# Patient Record
Sex: Female | Born: 1955
Health system: Southern US, Community
[De-identification: ages and names within clinical notes are randomized; demographics above are authoritative.]

## PROBLEM LIST (undated history)

## (undated) DIAGNOSIS — F1910 Other psychoactive substance abuse, uncomplicated: Secondary | ICD-10-CM

## (undated) DIAGNOSIS — E7800 Pure hypercholesterolemia, unspecified: Secondary | ICD-10-CM

## (undated) DIAGNOSIS — F419 Anxiety disorder, unspecified: Secondary | ICD-10-CM

## (undated) DIAGNOSIS — F32A Depression, unspecified: Secondary | ICD-10-CM

## (undated) DIAGNOSIS — F329 Major depressive disorder, single episode, unspecified: Secondary | ICD-10-CM

## (undated) DIAGNOSIS — E079 Disorder of thyroid, unspecified: Secondary | ICD-10-CM

## (undated) DIAGNOSIS — D219 Benign neoplasm of connective and other soft tissue, unspecified: Secondary | ICD-10-CM

## (undated) HISTORY — PX: OTHER SURGICAL HISTORY: SHX169

## (undated) HISTORY — DX: Disorder of thyroid, unspecified: E07.9

## (undated) HISTORY — DX: Depression, unspecified: F32.A

## (undated) HISTORY — DX: Pure hypercholesterolemia, unspecified: E78.00

## (undated) HISTORY — DX: Benign neoplasm of connective and other soft tissue, unspecified: D21.9

## (undated) HISTORY — DX: Anxiety disorder, unspecified: F41.9

## (undated) HISTORY — DX: Other psychoactive substance abuse, uncomplicated: F19.10

## (undated) HISTORY — PX: CATARACT EXTRACTION: SUR2

## (undated) HISTORY — DX: Major depressive disorder, single episode, unspecified: F32.9

## (undated) HISTORY — PX: UTERINE FIBROID SURGERY: SHX826

---

## 1998-04-10 HISTORY — PX: OTHER SURGICAL HISTORY: SHX169

## 2011-05-14 ENCOUNTER — Ambulatory Visit (INDEPENDENT_AMBULATORY_CARE_PROVIDER_SITE_OTHER): Payer: BC Managed Care – PPO | Admitting: Family Medicine

## 2011-05-14 VITALS — BP 105/66 | HR 65 | Temp 97.9°F | Resp 18 | Ht 69.5 in | Wt 200.6 lb

## 2011-05-14 DIAGNOSIS — R059 Cough, unspecified: Secondary | ICD-10-CM

## 2011-05-14 DIAGNOSIS — R058 Other specified cough: Secondary | ICD-10-CM

## 2011-05-14 DIAGNOSIS — R05 Cough: Secondary | ICD-10-CM

## 2011-05-14 MED ORDER — PREDNISONE 20 MG PO TABS: 40.0000 mg | ORAL_TABLET | Freq: Every day | ORAL | Status: AC

## 2011-05-14 NOTE — Progress Notes (Signed)
  Subjective:    Patient ID: Robin Gilbert, female    DOB: 1955/12/11, 56 y.o.   MRN: [id number]  HPI 56 yo female with URI complaints. 2-3 weeks.  Has gone to PCP 3 times.  Taken 2 different antibiotics - zpak, doxycycline.   Fever gone, feels better mostly except sore throat and chest congestion/cough (excessive mucus - clear) Doesn't interrupt sleep anymore.  Finished doxy yesterday.  Only took antibiotics, no other treatments.   Quit smoking 1997 No history of asthma. States ventilation at job not good - makes things worse.   Review of Systems Negative except as per HPI    Objective:   Physical Exam  Constitutional: She appears well-developed. No distress.  HENT:  Right Ear: Tympanic membrane, external ear and ear canal normal. Tympanic membrane is not injected, not scarred, not perforated, not erythematous, not retracted and not bulging.  Left Ear: Tympanic membrane, external ear and ear canal normal. Tympanic membrane is not injected, not scarred, not perforated, not erythematous, not retracted and not bulging.  Nose: No mucosal edema or rhinorrhea. Right sinus exhibits no maxillary sinus tenderness and no frontal sinus tenderness. Left sinus exhibits no maxillary sinus tenderness and no frontal sinus tenderness.  Mouth/Throat: Uvula is midline, oropharynx is clear and moist and mucous membranes are normal. No oropharyngeal exudate or tonsillar abscesses.  Cardiovascular: Normal rate, regular rhythm, normal heart sounds and intact distal pulses.   No murmur heard. Pulmonary/Chest: Effort normal and breath sounds normal. No respiratory distress. She has no wheezes. She has no rales.  Lymphadenopathy:       Head (right side): No submandibular and no preauricular adenopathy present.       Head (left side): No submandibular and no preauricular adenopathy present.       Right cervical: No superficial cervical and no posterior cervical adenopathy present.      Left cervical: No  superficial cervical and no posterior cervical adenopathy present.       Right: No supraclavicular adenopathy present.       Left: No supraclavicular adenopathy present.  Skin: Skin is warm and dry.          Assessment & Plan:  Post-infectious cough - prednisone 20, 2 daily for 5 days.  3rd round antibiotics not necessary

## 2011-05-14 NOTE — Patient Instructions (Signed)
Thank you for coming in today.  I appreciate your patience as we become more comfortable with our computer system.  Today you saw Ardeen Garland, MD. I hope you feel better quickly. Please review the information below regarding your diagnosis(es) at your leisure.     Cough, Adult  A cough is a reflex that helps clear your throat and airways. It can help heal the body or may be a reaction to an irritated airway. A cough may only last 2 or 3 weeks (acute) or may last more than 8 weeks (chronic).  CAUSES Acute cough:  Viral or bacterial infections.  Chronic cough:  Infections.   Allergies.   Asthma.   Post-nasal drip.   Smoking.   Heartburn or acid reflux.   Some medicines.   Chronic lung problems (COPD).   Cancer.  SYMPTOMS   Cough.   Fever.   Chest pain.   Increased breathing rate.   High-pitched whistling sound when breathing (wheezing).   Colored mucus that you cough up (sputum).  TREATMENT   A bacterial cough may be treated with antibiotic medicine.   A viral cough must run its course and will not respond to antibiotics.   Your caregiver may recommend other treatments if you have a chronic cough.  HOME CARE INSTRUCTIONS   Only take over-the-counter or prescription medicines for pain, discomfort, or fever as directed by your caregiver. Use cough suppressants only as directed by your caregiver.   Use a cold steam vaporizer or humidifier in your bedroom or home to help loosen secretions.   Sleep in a semi-upright position if your cough is worse at night.   Rest as needed.   Stop smoking if you smoke.  SEEK IMMEDIATE MEDICAL CARE IF:   You have pus in your sputum.   Your cough starts to worsen.   You cannot control your cough with suppressants and are losing sleep.   You begin coughing up blood.   You have difficulty breathing.   You develop pain which is getting worse or is uncontrolled with medicine.   You have a fever.  MAKE SURE YOU:    Understand these instructions.   Will watch your condition.   Will get help right away if you are not doing well or get worse.  Document Released: 09/23/2010 Document Revised: 12/07/2010 Document Reviewed: 09/23/2010 [facility] Patient Information 2012 [place], [place].

## 2011-08-22 ENCOUNTER — Other Ambulatory Visit (HOSPITAL_COMMUNITY)
Admission: RE | Admit: 2011-08-22 | Discharge: 2011-08-22 | Disposition: A | Discharge status: Home / Self Care | Payer: BC Managed Care – PPO | Source: Ambulatory Visit | Attending: Family Medicine | Admitting: Family Medicine

## 2011-08-22 DIAGNOSIS — Z1159 Encounter for screening for other viral diseases: Secondary | ICD-10-CM | POA: Insufficient documentation

## 2011-08-22 DIAGNOSIS — Z01419 Encounter for gynecological examination (general) (routine) without abnormal findings: Secondary | ICD-10-CM | POA: Insufficient documentation

## 2014-09-25 ENCOUNTER — Ambulatory Visit: Payer: BC Managed Care – PPO | Admitting: Podiatry

## 2015-10-28 ENCOUNTER — Encounter: Payer: BC Managed Care – PPO | Admitting: Neurology

## 2016-04-05 ENCOUNTER — Ambulatory Visit (INDEPENDENT_AMBULATORY_CARE_PROVIDER_SITE_OTHER): Payer: BC Managed Care – PPO | Admitting: Neurology

## 2016-04-05 ENCOUNTER — Encounter: Payer: Self-pay | Admitting: Neurology

## 2016-04-05 VITALS — BP 106/64 | HR 71 | Ht 70.0 in | Wt 193.0 lb

## 2016-04-05 DIAGNOSIS — R2689 Other abnormalities of gait and mobility: Secondary | ICD-10-CM

## 2016-04-05 DIAGNOSIS — R251 Tremor, unspecified: Secondary | ICD-10-CM | POA: Diagnosis not present

## 2016-04-05 DIAGNOSIS — R269 Unspecified abnormalities of gait and mobility: Secondary | ICD-10-CM

## 2016-04-05 DIAGNOSIS — W19XXXA Unspecified fall, initial encounter: Secondary | ICD-10-CM

## 2016-04-05 NOTE — Patient Instructions (Signed)
Remember to drink plenty of fluid, eat healthy meals and do not skip any meals. Try to eat protein with a every meal and eat a healthy snack such as fruit or nuts in between meals. Try to keep a regular sleep-wake schedule and try to exercise daily, particularly in the form of walking, 20-30 minutes a day, if you can.   As far as diagnostic testing: Thyroid, physical therapy  I would like to see you back as needed, sooner if we need to. Please call us with any interim questions, concerns, problems, updates or refill requests.   Our phone number is [phone]. We also have an after hours call service for urgent matters and there is a physician on-call for urgent questions. For any emergencies you know to call 911 or go to the nearest emergency room

## 2016-04-05 NOTE — Progress Notes (Signed)
[facility]    Provider:  Dr Jaynee Eagles Referring Provider: Lucianne Lei, MD Primary Care Physician:  Elyn Peers, MD  CC:  Abnormality of gait  HPI:  Robin Gilbert is a 60 y.o. female here as a referral from Dr. Criss Rosales for abnormality of gait and falls. She has a history of remote alcohol abuse and smoking. She had 2 falls a few months ago, she changed her shoes to make sure they were flat, she was walking and just fell. She was in boots and changed to flats and feels better but she was falling. She denies dizziness, SOB, she tripped unclear why, no alteration of consciousness. Her balance is not as good, she cannot walk heel-to-toe backwards with her eyes closed. She denies significant dizziness, she feels she is not as sharp as far as cognition, she has a history of alzheimers in the family. She has chronic back pain and herniated disks and goes to a chiropractor she has stiffness in her back especially with sitting and walking due to stiffness. She has a little numbness when she wakes up in her left hand but otherwise no paresthesias or numbness in the feet or sensory changes in the feet. Mother had parkinson's disease but maybe due to medications, she also had tremors unclear if medications or due to other causes. No weakness in the lower extremities. Not exercising or balance exercises daily.   Reviewed notes, labs and imaging from outside physicians, which showed:  CMP normal with BUN 21 and creatinine 0.78, CBC normal 02/21/2016. LDL 129 02/21/2016. Hepatitis B surface antigen, hepatitis C antibody, hepatitis B core antibody IgM nonreactive hepatitis A antibody IgM nonreactive.  Review of Systems: Patient complains of symptoms per HPI as well as the following symptoms: no CP, no SOB. Pertinent negatives per HPI. All others negative.   Social History   Social History  . Marital status: Divorced    Spouse name: N/A  . Number of children: 0  . Years of education:  Master's   Occupational History  . [employer]    Social History Main Topics  . Smoking status: Former Smoker    Quit date: 05/14/1995  . Smokeless tobacco: Never Used  . Alcohol use No  . Drug use: No  . Sexual activity: Not on file   Other Topics Concern  . Not on file   Social History Narrative   Lives alone   Caffeine use: Drinks coffee/tea/soda ocass    Family History  Problem Relation Age of Onset  . Ataxia Neg Hx     Past Medical History:  Diagnosis Date  . Anxiety   . Depression   . High cholesterol     Past Surgical History:  Procedure Laterality Date  . cyst removed     Vagina  . UTERINE FIBROID SURGERY      Current Outpatient Prescriptions  Medication Sig Dispense Refill  . QUERCETIN PO Take 1 tablet by mouth daily. Will resume taking this week 04/05/16 per pt    . UNABLE TO FIND Take 2 tablets by mouth daily. Med Name: Brain E    . UNABLE TO FIND Take 1 tablet by mouth 2 (two) times daily. Med Name: Acetyl-CH    . UNABLE TO FIND Take 2 tablets by mouth daily. Med Name: Organically bound minerals: for sleep    . vitamin C (ASCORBIC ACID) 500 MG tablet Take 500 mg by mouth daily. Takes 1-2 tablets per day    . VITAMIN E  PO Take 2 tablets by mouth daily. Will resume taking this week 04/05/16 per pt     No current facility-administered medications for this visit.     Allergies as of 04/05/2016 - Review Complete 04/05/2016  Allergen Reaction Noted  . Amoxicillin  05/14/2011    Vitals: BP 106/64 (BP Location: Right Arm, Patient Position: Sitting, Cuff Size: Normal)   Pulse 71   Ht 5\' 10"  (1.778 m)   Wt 193 lb (87.5 kg)   BMI 27.69 kg/m  Last Weight:  Wt Readings from Last 1 Encounters:  04/05/16 193 lb (87.5 kg)   Last Height:   Ht Readings from Last 1 Encounters:  04/05/16 5\' 10"  (1.778 m)   Physical exam: Exam: Gen: NAD, conversant, well nourised, well groomed                     CV: RRR, no MRG. No Carotid  Bruits. No peripheral edema, warm, nontender Eyes: Conjunctivae clear without exudates or hemorrhage  Neuro: Detailed Neurologic Exam  Speech:    Speech is normal; fluent and spontaneous with normal comprehension.  Cognition:    The patient is oriented to person, place, and time;     recent and remote memory intact;     language fluent;     normal attention, concentration,     fund of knowledge Cranial Nerves:    The pupils are equal, round, and reactive to light. The fundi are normal and spontaneous venous pulsations are present. Visual fields are full to finger confrontation. Extraocular movements are intact. Trigeminal sensation is intact and the muscles of mastication are normal. The face is symmetric. The palate elevates in the midline. Hearing intact. Voice is normal. Shoulder shrug is normal. The tongue has normal motion without fasciculations.   Coordination:    Normal finger to nose and heel to shin. Normal rapid alternating movements.   Gait:    Minimal imbalance heel to toe.   Otherwise perfectly normal  Motor Observation:    No asymmetry, no atrophy, mild left postural tremor Tone:    Normal muscle tone.    Posture:    Posture is normal. normal erect    Strength:    Strength is V/V in the upper and lower limbs.      Sensation: intact to LT, pin prick, vibration, proprioception     Reflex Exam:  DTR's:    Deep tendon reflexes in the upper and lower extremities are normal bilaterally.   Toes:    The toes are downgoing bilaterally.   Clonus:    Clonus is absent.     Assessment/Plan:   60 y.o. female here as a referral from Dr. Criss Rosales for abnormality of gait and falls. She has a history of remote alcohol abuse and smoking. Neurologic exam is essentially normal with minimal imbalance heel-to-toe and a mild left postural tremor.  Postural tremor: May be essential tremor. Check tsh Imbalance: Will refer to physical therapy  Follow up as needed  Cc: Dr.  Girard Cooter, MD  [facility] [address] [place], [place] [address]  Phone [phone] Fax [phone]  A total of 40 minutes was spent in with this patient face-to-face. Over half this time was spent on counseling patient on the gait abnormality diagnosis and different therapeutic options available.

## 2016-04-06 ENCOUNTER — Telehealth: Payer: Self-pay | Admitting: *Deleted

## 2016-04-06 LAB — TSH: TSH: 1.76 u[IU]/mL (ref 0.450–4.500)

## 2016-04-06 NOTE — Telephone Encounter (Signed)
Per Daun Peacock, NP, LVM informing patient her thyroid lab result is normal. Left number for any questions.

## 2016-04-06 NOTE — Telephone Encounter (Signed)
Addendum/correction: message left per Dr Cathren Laine result note, not Daun Peacock, NP.

## 2016-04-07 ENCOUNTER — Encounter: Payer: Self-pay | Admitting: Neurology

## 2016-04-07 DIAGNOSIS — W19XXXA Unspecified fall, initial encounter: Secondary | ICD-10-CM | POA: Insufficient documentation

## 2016-04-07 DIAGNOSIS — R296 Repeated falls: Secondary | ICD-10-CM | POA: Insufficient documentation

## 2016-05-11 HISTORY — PX: COLONOSCOPY: SHX174

## 2016-05-25 ENCOUNTER — Ambulatory Visit: Payer: BC Managed Care – PPO | Attending: Neurology | Admitting: Physical Therapy

## 2016-06-05 LAB — HM COLONOSCOPY

## 2016-06-12 ENCOUNTER — Ambulatory Visit: Payer: BC Managed Care – PPO | Attending: Neurology | Admitting: Physical Therapy

## 2017-09-24 ENCOUNTER — Other Ambulatory Visit: Payer: Self-pay | Admitting: Family Medicine

## 2017-09-24 DIAGNOSIS — Z1231 Encounter for screening mammogram for malignant neoplasm of breast: Secondary | ICD-10-CM

## 2020-02-26 ENCOUNTER — Ambulatory Visit: Payer: BC Managed Care – PPO | Attending: Family

## 2020-02-26 DIAGNOSIS — Z23 Encounter for immunization: Secondary | ICD-10-CM

## 2020-04-23 ENCOUNTER — Other Ambulatory Visit: Payer: Self-pay | Admitting: Internal Medicine

## 2020-04-23 DIAGNOSIS — R1900 Intra-abdominal and pelvic swelling, mass and lump, unspecified site: Secondary | ICD-10-CM

## 2020-05-05 ENCOUNTER — Other Ambulatory Visit: Payer: Self-pay

## 2020-05-05 ENCOUNTER — Ambulatory Visit
Admission: RE | Admit: 2020-05-05 | Discharge: 2020-05-05 | Disposition: A | Discharge status: Home / Self Care | Payer: BC Managed Care – PPO | Source: Ambulatory Visit | Attending: Internal Medicine | Admitting: Internal Medicine

## 2020-05-05 DIAGNOSIS — R1900 Intra-abdominal and pelvic swelling, mass and lump, unspecified site: Secondary | ICD-10-CM

## 2020-05-12 ENCOUNTER — Other Ambulatory Visit: Payer: Self-pay

## 2020-06-09 NOTE — Progress Notes (Signed)
   Covid-19 Vaccination Clinic  Name:  Robin Gilbert    MRN: [id number] DOB: October 13, 1955  06/09/2020  [name] was observed post Covid-19 immunization for 15 minutes without incident. She was provided with Vaccine Information Sheet and instruction to access the V-Safe system.   [name] was instructed to call 911 with any severe reactions post vaccine: Marland Kitchen Difficulty breathing  . Swelling of face and throat  . A fast heartbeat  . A bad rash all over body  . Dizziness and weakness   Immunizations Administered    Name Date Dose VIS Date Route   Moderna Covid-19 Booster Vaccine 02/26/2020 10:15 AM 0.25 mL 01/28/2020 Intramuscular   Manufacturer: Moderna   Lot: [id number]   [place]: 22179-810-25

## 2020-07-08 NOTE — Progress Notes (Addendum)
[facility] Note  07/12/2020     CHIEF COMPLAINT Patient presents for Retina Evaluation   HISTORY OF PRESENT ILLNESS: Robin Gilbert is a 65 y.o. female who presents to the clinic today for:   HPI    Retina Evaluation    In both eyes.  I, the attending physician,  performed the HPI with the patient and updated documentation appropriately.          Comments    Pt is here for retinal evaluation. Referred by Dr. Katy Fitch. Pt states vision is OK. Dr. Katy Fitch recommended cat sx. Dr wanted eval before having cataract sx. No eye pain.        Last edited by Bernarda Caffey, MD on 07/12/2020  9:11 AM. (History)    pt states she has a hx of high cholesterol, but is not on medication for it, she states she changed her diet and is now vegan and her cholesterol is much better  Referring physician: Debbra Riding, MD [address] [place],  [place] [address]  HISTORICAL INFORMATION:   Selected notes from the MEDICAL RECORD NUMBER Referred by Dr. Wyatt Portela for focal perivascular sheathing vs exudates OD, inf nasal periphery  LEE:  Ocular Hx- PMH-    CURRENT MEDICATIONS: No current outpatient medications on file. (Ophthalmic Drugs)   No current facility-administered medications for this visit. (Ophthalmic Drugs)   Current Outpatient Medications (Other)  Medication Sig  . QUERCETIN PO Take 1 tablet by mouth daily. Will resume taking this week 04/05/16 per pt  . UNABLE TO FIND Take 2 tablets by mouth daily. Med Name: Brain E  . UNABLE TO FIND Take 1 tablet by mouth 2 (two) times daily. Med Name: Acetyl-CH  . UNABLE TO FIND Take 2 tablets by mouth daily. Med Name: Organically bound minerals: for sleep  . vitamin C (ASCORBIC ACID) 500 MG tablet Take 500 mg by mouth daily. Takes 1-2 tablets per day  . VITAMIN E PO Take 2 tablets by mouth daily. Will resume taking this week 04/05/16 per pt   No current facility-administered medications for this visit.  (Other)      REVIEW OF SYSTEMS: ROS    Positive for: Eyes   Last edited by Theodore Demark, COA on 07/12/2020  8:36 AM. (History)       ALLERGIES Allergies  Allergen Reactions  . Amoxicillin     Pt states she had to take probiotics when she took this. Did not agree with her    PAST MEDICAL HISTORY Past Medical History:  Diagnosis Date  . Anxiety   . Depression   . High cholesterol    Past Surgical History:  Procedure Laterality Date  . cyst removed     Vagina  . UTERINE FIBROID SURGERY      FAMILY HISTORY Family History  Problem Relation Age of Onset  . Ataxia Neg Hx     SOCIAL HISTORY Social History   Tobacco Use  . Smoking status: Former Smoker    Quit date: 05/14/1995    Years since quitting: 25.1  . Smokeless tobacco: Never Used  Substance Use Topics  . Alcohol use: No  . Drug use: No         OPHTHALMIC EXAM:  Base Eye Exam    Visual Acuity (Snellen - Linear)      Right Left   Dist cc 20/25 -2 20/25 +2   Dist ph cc 20/20 -2 NI  Tonometry (Tonopen, 8:32 AM)      Right Left   Pressure 22 20       Pupils      Dark Light Shape React APD   Right 4 3 Round Brisk None   Left 4 3 Round Brisk None       Visual Fields (Counting fingers)      Left Right    Full Full       Extraocular Movement      Right Left    Full, Ortho Full, Ortho       Neuro/Psych    Oriented x3: Yes   Mood/Affect: Normal       Dilation    Both eyes: 1.0% Mydriacyl, 2.5% Phenylephrine @ 8:32 AM        Slit Lamp and Fundus Exam    Slit Lamp Exam      Right Left   Lids/Lashes Dermatochalasis - upper lid Dermatochalasis - upper lid   Conjunctiva/Sclera mild melanosis mild melanosis   Cornea trace PEE trace PEE   Anterior Chamber Deep and quiet Deep and quiet   Iris Round and dilated Round and dilated   Lens 2+ Nuclear sclerosis, 1+ Cortical cataract 2-3+ Nuclear sclerosis, 2+ Cortical cataract   Vitreous Vitreous syneresis Vitreous syneresis,  Posterior vitreous detachment, Weiss ring       Fundus Exam      Right Left   Disc extreme tilt, PPA/PPP, +SVP extreme tilt, PPA/PPP   C/D Ratio 0.5 0.3   Macula Flat, Good foveal reflex, RPE mottling, No heme or edema Flat, irregular foveal reflex, RPE mottling and clumping, No heme or edema   Vessels attenuated, mild tortuousity, periphery sheathing v exudation IN venules mild attenuation, mild tortuousity   Periphery Attached, no heme, perivascular exudations v sheating inferonasal peripheral venules  Attached, no heme        Refraction    Wearing Rx      Sphere Cylinder Axis Add   Right -4.75 +1.75 017 +1.75   Left -3.25 +0.50 175 +1.75       Manifest Refraction (Auto)      Sphere Cylinder Axis Dist VA   Right -4.50 +1.50 015 20/25   Left -3.75 +0.75 150 20/25          IMAGING AND PROCEDURES  Imaging and Procedures for 07/12/2020  OCT, Retina - OU - Both Eyes       Right Eye Quality was good. Central Foveal Thickness: 258. Progression has no prior data. Findings include normal foveal contour, no IRF, no SRF, myopic contour.   Left Eye Quality was good. Central Foveal Thickness: 264. Progression has no prior data. Findings include no IRF, abnormal foveal contour, no SRF, myopic contour (Foveal notch).   Notes *Images captured and stored on drive  Diagnosis / Impression:  NFP; no IRF/SRF OU Myopic contour OU Small foveal notch OS  Clinical management:  See below  Abbreviations: NFP - Normal foveal profile. CME - cystoid macular edema. PED - pigment epithelial detachment. IRF - intraretinal fluid. SRF - subretinal fluid. EZ - ellipsoid zone. ERM - epiretinal membrane. ORA - outer retinal atrophy. ORT - outer retinal tubulation. SRHM - subretinal hyper-reflective material. IRHM - intraretinal hyper-reflective material        Fluorescein Angiography Optos (Transit OD)       Right Eye   Progression has no prior data. Early phase findings include window  defect, vascular perfusion defect. Mid/Late phase findings include window defect, vascular perfusion defect (  No leakage, trace peripheral MA nasal periphery).   Left Eye   Progression has no prior data. Early phase findings include window defect, vascular perfusion defect. Mid/Late phase findings include window defect, vascular perfusion defect (trace peripheral MA far periphery, no leakage or vasculitis ).   Notes **Images stored on drive**  Impression: trace peripheral MA OU No leakage or vasculitis OU                  ASSESSMENT/PLAN:    ICD-10-CM   1. Retinal exudates  H35.89   2. Retinal edema  H35.81 OCT, Retina - OU - Both Eyes  3. Essential hypertension  I10   4. Hypertensive retinopathy of both eyes  H35.033 Fluorescein Angiography Optos (Transit OD)  5. Combined forms of age-related cataract of both eyes  H25.813     1. Focal perivascular retinal exudates OD  - inferonasal peripheral venules with mild perivascular exudation vs sheathing  - no heme  - FA 4.4.22 show no leakage or vasculitis corresponding to exudates  - discussed findings, prognosis  - no treatment indicated or recommended at this time  - monitor  - clear from a retina standpoint to proceed with cataract surgery when pt and surgeon are ready  - f/u in 3 mos  2. No retinal edema on exam or OCT  3,4. Hypertensive retinopathy OU - discussed importance of tight BP control - monitor  5. Mixed Cataract OU - The symptoms of cataract, surgical options, and treatments and risks were discussed with patient. - discussed diagnosis and progression - under the expert management of Dr. Zenia Resides - clear from a retina standpoint to proceed with cataract surgery when pt and surgeon are ready    Ophthalmic Meds Ordered this visit:  No orders of the defined types were placed in this encounter.      Return in about 3 months (around 10/11/2020) for Dilated Exam, OCT.  There are no Patient Instructions  on file for this visit.  This document serves as a record of services personally performed by Gardiner Sleeper, MD, PhD. It was created on their behalf by Leeann Must, [place], an ophthalmic technician. The creation of this record is the provider's dictation and/or activities during the visit.    Electronically signed by: Leeann Must, COA 03.31.2022 1:21 PM   This document serves as a record of services personally performed by Gardiner Sleeper, MD, PhD. It was created on their behalf by San Jetty. Owens Shark, OA an ophthalmic technician. The creation of this record is the provider's dictation and/or activities during the visit.    Electronically signed by: San Jetty. Owens Shark, [place] 04.04.2022 1:21 PM  Gardiner Sleeper, M.D., Ph.D. Diseases & Surgery of the Retina and [facility] [place] 07/12/2020   I have reviewed the above documentation for accuracy and completeness, and I agree with the above. Gardiner Sleeper, M.D., Ph.D. 07/12/20 1:21 PM   Abbreviations: M myopia (nearsighted); A astigmatism; H hyperopia (farsighted); P presbyopia; Mrx spectacle prescription;  CTL contact lenses; OD right eye; OS left eye; OU both eyes  XT exotropia; ET esotropia; PEK punctate epithelial keratitis; PEE punctate epithelial erosions; DES dry eye syndrome; MGD meibomian gland dysfunction; ATs artificial tears; PFAT's preservative free artificial tears; [place] nuclear sclerotic cataract; PSC posterior subcapsular cataract; ERM epi-retinal membrane; PVD posterior vitreous detachment; RD retinal detachment; DM diabetes mellitus; DR diabetic retinopathy; NPDR non-proliferative diabetic retinopathy; PDR proliferative diabetic retinopathy; CSME clinically significant macular edema; DME diabetic  macular edema; dbh dot blot hemorrhages; CWS cotton wool spot; POAG primary open angle glaucoma; C/D cup-to-disc ratio; HVF humphrey visual field; GVF goldmann visual field; OCT optical coherence tomography; IOP  intraocular pressure; BRVO Branch retinal vein occlusion; CRVO central retinal vein occlusion; CRAO central retinal artery occlusion; BRAO branch retinal artery occlusion; RT retinal tear; SB scleral buckle; PPV pars plana vitrectomy; VH Vitreous hemorrhage; PRP panretinal laser photocoagulation; IVK intravitreal kenalog; VMT vitreomacular traction; MH Macular hole;  NVD neovascularization of the disc; NVE neovascularization elsewhere; AREDS age related eye disease study; ARMD age related macular degeneration; POAG primary open angle glaucoma; EBMD epithelial/anterior basement membrane dystrophy; ACIOL anterior chamber intraocular lens; IOL intraocular lens; PCIOL posterior chamber intraocular lens; Phaco/IOL phacoemulsification with intraocular lens placement; [place] photorefractive keratectomy; LASIK laser assisted in situ keratomileusis; HTN hypertension; DM diabetes mellitus; COPD chronic obstructive pulmonary disease

## 2020-07-12 ENCOUNTER — Other Ambulatory Visit: Payer: Self-pay

## 2020-07-12 ENCOUNTER — Ambulatory Visit (INDEPENDENT_AMBULATORY_CARE_PROVIDER_SITE_OTHER): Payer: Medicare HMO | Admitting: Ophthalmology

## 2020-07-12 ENCOUNTER — Encounter (INDEPENDENT_AMBULATORY_CARE_PROVIDER_SITE_OTHER): Payer: Self-pay | Admitting: Ophthalmology

## 2020-07-12 DIAGNOSIS — I10 Essential (primary) hypertension: Secondary | ICD-10-CM | POA: Diagnosis not present

## 2020-07-12 DIAGNOSIS — H3581 Retinal edema: Secondary | ICD-10-CM | POA: Diagnosis not present

## 2020-07-12 DIAGNOSIS — H3589 Other specified retinal disorders: Secondary | ICD-10-CM

## 2020-07-12 DIAGNOSIS — H25813 Combined forms of age-related cataract, bilateral: Secondary | ICD-10-CM

## 2020-07-12 DIAGNOSIS — H35033 Hypertensive retinopathy, bilateral: Secondary | ICD-10-CM | POA: Diagnosis not present

## 2020-09-08 ENCOUNTER — Ambulatory Visit: Payer: Medicare HMO | Admitting: Family Medicine

## 2020-09-10 DIAGNOSIS — M9904 Segmental and somatic dysfunction of sacral region: Secondary | ICD-10-CM | POA: Diagnosis not present

## 2020-09-10 DIAGNOSIS — M9902 Segmental and somatic dysfunction of thoracic region: Secondary | ICD-10-CM | POA: Diagnosis not present

## 2020-09-10 DIAGNOSIS — M5136 Other intervertebral disc degeneration, lumbar region: Secondary | ICD-10-CM | POA: Diagnosis not present

## 2020-09-10 DIAGNOSIS — M9901 Segmental and somatic dysfunction of cervical region: Secondary | ICD-10-CM | POA: Diagnosis not present

## 2020-09-10 DIAGNOSIS — M9903 Segmental and somatic dysfunction of lumbar region: Secondary | ICD-10-CM | POA: Diagnosis not present

## 2020-09-13 DIAGNOSIS — M9902 Segmental and somatic dysfunction of thoracic region: Secondary | ICD-10-CM | POA: Diagnosis not present

## 2020-09-13 DIAGNOSIS — M9903 Segmental and somatic dysfunction of lumbar region: Secondary | ICD-10-CM | POA: Diagnosis not present

## 2020-09-13 DIAGNOSIS — M9904 Segmental and somatic dysfunction of sacral region: Secondary | ICD-10-CM | POA: Diagnosis not present

## 2020-09-13 DIAGNOSIS — M5136 Other intervertebral disc degeneration, lumbar region: Secondary | ICD-10-CM | POA: Diagnosis not present

## 2020-09-13 DIAGNOSIS — M9901 Segmental and somatic dysfunction of cervical region: Secondary | ICD-10-CM | POA: Diagnosis not present

## 2020-09-15 DIAGNOSIS — M9901 Segmental and somatic dysfunction of cervical region: Secondary | ICD-10-CM | POA: Diagnosis not present

## 2020-09-15 DIAGNOSIS — M5136 Other intervertebral disc degeneration, lumbar region: Secondary | ICD-10-CM | POA: Diagnosis not present

## 2020-09-15 DIAGNOSIS — M9903 Segmental and somatic dysfunction of lumbar region: Secondary | ICD-10-CM | POA: Diagnosis not present

## 2020-09-15 DIAGNOSIS — M9902 Segmental and somatic dysfunction of thoracic region: Secondary | ICD-10-CM | POA: Diagnosis not present

## 2020-09-15 DIAGNOSIS — M9904 Segmental and somatic dysfunction of sacral region: Secondary | ICD-10-CM | POA: Diagnosis not present

## 2020-09-21 DIAGNOSIS — M9903 Segmental and somatic dysfunction of lumbar region: Secondary | ICD-10-CM | POA: Diagnosis not present

## 2020-09-21 DIAGNOSIS — M5136 Other intervertebral disc degeneration, lumbar region: Secondary | ICD-10-CM | POA: Diagnosis not present

## 2020-09-21 DIAGNOSIS — M9904 Segmental and somatic dysfunction of sacral region: Secondary | ICD-10-CM | POA: Diagnosis not present

## 2020-09-21 DIAGNOSIS — M9902 Segmental and somatic dysfunction of thoracic region: Secondary | ICD-10-CM | POA: Diagnosis not present

## 2020-09-21 DIAGNOSIS — M9901 Segmental and somatic dysfunction of cervical region: Secondary | ICD-10-CM | POA: Diagnosis not present

## 2020-09-23 ENCOUNTER — Other Ambulatory Visit: Payer: Self-pay

## 2020-09-23 ENCOUNTER — Ambulatory Visit (INDEPENDENT_AMBULATORY_CARE_PROVIDER_SITE_OTHER): Payer: Medicare HMO | Admitting: Family Medicine

## 2020-09-23 ENCOUNTER — Encounter: Payer: Self-pay | Admitting: Family Medicine

## 2020-09-23 VITALS — BP 120/68 | HR 68 | Ht 70.0 in | Wt 193.4 lb

## 2020-09-23 DIAGNOSIS — N62 Hypertrophy of breast: Secondary | ICD-10-CM

## 2020-09-23 DIAGNOSIS — Z7689 Persons encountering health services in other specified circumstances: Secondary | ICD-10-CM

## 2020-09-23 DIAGNOSIS — E7800 Pure hypercholesterolemia, unspecified: Secondary | ICD-10-CM

## 2020-09-23 NOTE — Progress Notes (Signed)
   Subjective:    Patient ID: Robin Gilbert, female    DOB: 1955-10-14, 65 y.o.   MRN: [id number]  HPI Chief Complaint  Patient presents with   new pt get established    New pt get established. Would like hormones checked. Has swelling in breasts but had mammogram and everyhting was normal- PQ-9.    She is new to the practice and here to establish care Previous medical care: Dr. Willey Blade, [facility] and last there for UTI   Other providers: GI- Dr. Collene Mares OB/GYN Neurologist- Dr. Bedelia Person   States she recently did [facility] and brought in her results today which she would like for me to review.  Her LDL was elevated, otherwise did not see any issues.   States her breasts have been enlarging over the past 2 years.  Reports going from a size due to her size H cup.  States she has not gained weight. states she would like to look for reasons for this including checking her hormone levels. Mammogram in January 2022 at [facility] and negative per patient.  I do not have this result today. Denies any other breast abnormality.  No breast pain or nipple discharge.  HTN-discussed that this diagnosis is in her chart but she denies having a history of hypertension.  Retinopathy related to HTN is also in her chart  Hx of fibroids      Review of Systems Pertinent positives and negatives in the history of present illness.     Objective:   Physical Exam BP 120/68   Pulse 68   Ht 5\' 10"  (1.778 m)   Wt 193 lb 6.4 oz (87.7 kg)   BMI 27.75 kg/m   Alert and in no distress. Neck is supple without adenopathy or thyromegaly. Cardiac exam shows a regular sinus rhythm without murmurs or gallops. Lungs are clear to auscultation.       Assessment & Plan:  Pure hypercholesterolemia  Breast hypertrophy - Plan: Ambulatory referral to Gynecology  Encounter to establish care  Reviewed lab and all results from recent [facility] checkup.  This will also be scanned into her EMR. Elevated  LDL.  Recommend a low-fat, low-cholesterol diet. Reports a 2-year history of breast enlargement that is unexplained per patient.  Recent mammogram which was negative.  I do not have this report today.  No red flag symptoms. Referral to gynecology

## 2020-09-24 DIAGNOSIS — M5136 Other intervertebral disc degeneration, lumbar region: Secondary | ICD-10-CM | POA: Diagnosis not present

## 2020-09-24 DIAGNOSIS — M9902 Segmental and somatic dysfunction of thoracic region: Secondary | ICD-10-CM | POA: Diagnosis not present

## 2020-09-24 DIAGNOSIS — M9904 Segmental and somatic dysfunction of sacral region: Secondary | ICD-10-CM | POA: Diagnosis not present

## 2020-09-24 DIAGNOSIS — M9903 Segmental and somatic dysfunction of lumbar region: Secondary | ICD-10-CM | POA: Diagnosis not present

## 2020-09-24 DIAGNOSIS — M9901 Segmental and somatic dysfunction of cervical region: Secondary | ICD-10-CM | POA: Diagnosis not present

## 2020-09-29 ENCOUNTER — Ambulatory Visit: Payer: Medicare HMO | Admitting: Family Medicine

## 2020-10-01 DIAGNOSIS — M9904 Segmental and somatic dysfunction of sacral region: Secondary | ICD-10-CM | POA: Diagnosis not present

## 2020-10-01 DIAGNOSIS — M9901 Segmental and somatic dysfunction of cervical region: Secondary | ICD-10-CM | POA: Diagnosis not present

## 2020-10-01 DIAGNOSIS — M9903 Segmental and somatic dysfunction of lumbar region: Secondary | ICD-10-CM | POA: Diagnosis not present

## 2020-10-01 DIAGNOSIS — M5136 Other intervertebral disc degeneration, lumbar region: Secondary | ICD-10-CM | POA: Diagnosis not present

## 2020-10-01 DIAGNOSIS — M9902 Segmental and somatic dysfunction of thoracic region: Secondary | ICD-10-CM | POA: Diagnosis not present

## 2020-10-06 ENCOUNTER — Encounter: Payer: Self-pay | Admitting: Family Medicine

## 2020-10-07 NOTE — Progress Notes (Addendum)
[facility] Note  10/13/2020     CHIEF COMPLAINT Patient presents for Retina Follow Up   HISTORY OF PRESENT ILLNESS: Robin Gilbert is a 65 y.o. female who presents to the clinic today for:   HPI     Retina Follow Up   Patient presents with  Other (Focal perivascular exudates).  In right eye.  Severity is moderate.  Duration of 3 months.  Since onset it is stable.  I, the attending physician,  performed the HPI with the patient and updated documentation appropriately.        Comments   Patient states vision blurred OU. Vision is worse OS than OD. Needs retinal clearance for cataract surgery for Dr. Katy Fitch.       Last edited by Bernarda Caffey, MD on 10/14/2020  3:23 PM.    Pt seeing well.  Has not yet had cat sx.   Referring physician: Debbra Riding, MD [address] [place],  [place] [address]  HISTORICAL INFORMATION:   Selected notes from the MEDICAL RECORD NUMBER Referred by Dr. Wyatt Portela for focal perivascular sheathing vs exudates OD, inf nasal periphery   CURRENT MEDICATIONS: No current outpatient medications on file. (Ophthalmic Drugs)   No current facility-administered medications for this visit. (Ophthalmic Drugs)   Current Outpatient Medications (Other)  Medication Sig   B Complex Vitamins (B COMPLEX 1 PO) B Complex  Take once a day   Cholecalciferol (D3 ADULT PO) Take by mouth.   CRANBERRY PO Take by mouth.   Emollient (COLLAGEN EX) Collagen Peptides  Take 1 scoop in 8-10 ounces of water or beverage daily   Ferrous Sulfate (IRON PO) iron  Take 2 tab po twice a week   Gymnema Sylvestris Leaf POWD by Does not apply route.   MAGNESIUM CHLORIDE PO Take by mouth.   Multiple Vitamins-Minerals (ZINC PO) Take by mouth.   vitamin C (ASCORBIC ACID) 500 MG tablet Take 500 mg by mouth daily. Takes 1-2 tablets per day   VITAMIN E PO Take 2 tablets by mouth daily. Will resume taking this week 04/05/16 per pt   No current  facility-administered medications for this visit. (Other)      REVIEW OF SYSTEMS: ROS   Positive for: Eyes Negative for: Constitutional, Gastrointestinal, Neurological, Skin, Genitourinary, Musculoskeletal, HENT, Endocrine, Cardiovascular, Respiratory, Psychiatric, Allergic/Imm, Heme/Lymph Last edited by Roselee Nova D, COT on 10/13/2020 10:02 AM.        ALLERGIES Allergies  Allergen Reactions   Amoxicillin     Pt states she had to take probiotics when she took this. Did not agree with her    PAST MEDICAL HISTORY Past Medical History:  Diagnosis Date   Anxiety    Depression    High cholesterol    Past Surgical History:  Procedure Laterality Date   cyst removed     Vagina   UTERINE FIBROID SURGERY      FAMILY HISTORY Family History  Problem Relation Age of Onset   Ataxia Neg Hx     SOCIAL HISTORY Social History   Tobacco Use   Smoking status: Former    Pack years: 0.00    Types: Cigarettes    Quit date: 05/14/1995    Years since quitting: 25.4   Smokeless tobacco: Never  Substance Use Topics   Alcohol use: No   Drug use: No         OPHTHALMIC EXAM:  Base Eye Exam  Visual Acuity (Snellen - Linear)       Right Left   Dist cc 20/25 -2 20/25 +1   Dist ph cc NI NI    Correction: Glasses         Tonometry (Tonopen, 10:07 AM)       Right Left   Pressure 16 13         Pupils       Dark Light Shape React APD   Right 4 3 Round Brisk None   Left 4 3 Round Brisk None         Visual Fields (Counting fingers)       Left Right    Full Full         Extraocular Movement       Right Left    Full, Ortho Full, Ortho         Neuro/Psych     Oriented x3: Yes   Mood/Affect: Normal         Dilation     Both eyes: 1.0% Mydriacyl, 2.5% Phenylephrine @ 10:07 AM           Slit Lamp and Fundus Exam     Slit Lamp Exam       Right Left   Lids/Lashes Dermatochalasis - upper lid Dermatochalasis - upper lid    Conjunctiva/Sclera mild melanosis mild melanosis   Cornea trace PEE, arcus trace PEE, arcus   Anterior Chamber Deep and quiet Deep and quiet   Iris Round and dilated Round and dilated   Lens 2+ Nuclear sclerosis, 2+ Cortical cataract 2+ Nuclear sclerosis, 2+ Cortical cataract   Vitreous Vitreous syneresis, PVD Vitreous syneresis, Posterior vitreous detachment, Weiss ring         Fundus Exam       Right Left   Disc extreme tilt, PPA/PPP, +SVP extreme tilt, PPA/PPP   C/D Ratio 0.5 0.3   Macula Flat, Good foveal reflex, RPE mottling, No heme or edema Flat, irregular foveal reflex, RPE mottling and clumping, No heme or edema   Vessels attenuated, mild tortuousity, periphery sheathing vs exudation IN venules--stable from prior mild attenuation, mild tortuousity   Periphery Attached, no heme, perivascular exudation v sheating inferonasal peripheral venules  Attached, no heme           Refraction     Wearing Rx       Sphere Cylinder Axis Add   Right -4.75 +1.75 017 +1.75   Left -3.25 +0.50 175 +1.75            IMAGING AND PROCEDURES  Imaging and Procedures for 10/13/2020  OCT, Retina - OU - Both Eyes       Right Eye Quality was good. Central Foveal Thickness: 270. Progression has been stable. Findings include normal foveal contour, no IRF, no SRF, myopic contour.   Left Eye Quality was good. Central Foveal Thickness: 283. Progression has been stable. Findings include no IRF, abnormal foveal contour, no SRF, myopic contour (Foveal notch).   Notes *Images captured and stored on drive  Diagnosis / Impression:  NFP; no IRF/SRF OU Myopic contour OU Small foveal notch OS  Clinical management:  See below  Abbreviations: NFP - Normal foveal profile. CME - cystoid macular edema. PED - pigment epithelial detachment. IRF - intraretinal fluid. SRF - subretinal fluid. EZ - ellipsoid zone. ERM - epiretinal membrane. ORA - outer retinal atrophy. ORT - outer retinal tubulation.  SRHM - subretinal hyper-reflective material. IRHM - intraretinal hyper-reflective material  ASSESSMENT/PLAN:    ICD-10-CM   1. Retinal exudates  H35.89 OCT, Retina - OU - Both Eyes    2. Essential hypertension  I10     3. Hypertensive retinopathy of both eyes  H35.033 OCT, Retina - OU - Both Eyes    4. Combined forms of age-related cataract of both eyes  H25.813        1. Focal perivascular retinal exudation OD -- stable  - inferonasal peripheral venules with mild perivascular exudation vs sheathing--stable from prior  - no heme  - FA 4.4.22 shows no leakage or vasculitis corresponding to exudates  - discussed findings, prognosis  - no treatment indicated or recommended at this time  - monitor   - clear from a retina standpoint to proceed with cataract surgery when pt and surgeon are ready  - f/u in 6-9 mos  2,3. Hypertensive retinopathy OU - discussed importance of tight BP control - monitor   4. Mixed Cataract OU - The symptoms of cataract, surgical options, and treatments and risks were discussed with patient. - discussed diagnosis and progression - under the expert management of Dr. Zenia Resides - clear from a retina standpoint to proceed with cataract surgery when pt and surgeon are ready    Ophthalmic Meds Ordered this visit:  No orders of the defined types were placed in this encounter.      Return for 6-9 mo f/u for focal perivascular retinal exudates OD w/DFE&OCT.  There are no Patient Instructions on file for this visit.  This document serves as a record of services personally performed by Gardiner Sleeper, MD, PhD. It was created on their behalf by Leonie Douglas, an ophthalmic technician. The creation of this record is the provider's dictation and/or activities during the visit.    Electronically signed by: Leonie Douglas COA, 10/14/20  3:27 PM  This document serves as a record of services personally performed by Gardiner Sleeper, MD, PhD. It  was created on their behalf by Estill Bakes, COT an ophthalmic technician. The creation of this record is the provider's dictation and/or activities during the visit.    Electronically signed by: Estill Bakes, COT 7.6.22 @ 3:27 PM   Gardiner Sleeper, M.D., Ph.D. Diseases & Surgery of the Retina and [place] 10/13/2020   I have reviewed the above documentation for accuracy and completeness, and I agree with the above. Gardiner Sleeper, M.D., Ph.D. 10/14/20 3:27 PM   Abbreviations: M myopia (nearsighted); A astigmatism; H hyperopia (farsighted); P presbyopia; Mrx spectacle prescription;  CTL contact lenses; OD right eye; OS left eye; OU both eyes  XT exotropia; ET esotropia; PEK punctate epithelial keratitis; PEE punctate epithelial erosions; DES dry eye syndrome; MGD meibomian gland dysfunction; ATs artificial tears; PFAT's preservative free artificial tears; [place] nuclear sclerotic cataract; PSC posterior subcapsular cataract; ERM epi-retinal membrane; PVD posterior vitreous detachment; RD retinal detachment; DM diabetes mellitus; DR diabetic retinopathy; NPDR non-proliferative diabetic retinopathy; PDR proliferative diabetic retinopathy; CSME clinically significant macular edema; DME diabetic macular edema; dbh dot blot hemorrhages; CWS cotton wool spot; POAG primary open angle glaucoma; C/D cup-to-disc ratio; HVF humphrey visual field; GVF goldmann visual field; OCT optical coherence tomography; IOP intraocular pressure; BRVO Branch retinal vein occlusion; CRVO central retinal vein occlusion; CRAO central retinal artery occlusion; BRAO branch retinal artery occlusion; RT retinal tear; SB scleral buckle; PPV pars plana vitrectomy; VH Vitreous hemorrhage; PRP panretinal laser photocoagulation; IVK intravitreal kenalog; VMT vitreomacular traction; MH Macular hole;  NVD neovascularization of the disc; NVE neovascularization elsewhere; AREDS age related eye disease study;  ARMD age related macular degeneration; POAG primary open angle glaucoma; EBMD epithelial/anterior basement membrane dystrophy; ACIOL anterior chamber intraocular lens; IOL intraocular lens; PCIOL posterior chamber intraocular lens; Phaco/IOL phacoemulsification with intraocular lens placement; [place] photorefractive keratectomy; LASIK laser assisted in situ keratomileusis; HTN hypertension; DM diabetes mellitus; COPD chronic obstructive pulmonary disease

## 2020-10-08 DIAGNOSIS — M9902 Segmental and somatic dysfunction of thoracic region: Secondary | ICD-10-CM | POA: Diagnosis not present

## 2020-10-08 DIAGNOSIS — M5136 Other intervertebral disc degeneration, lumbar region: Secondary | ICD-10-CM | POA: Diagnosis not present

## 2020-10-08 DIAGNOSIS — M9903 Segmental and somatic dysfunction of lumbar region: Secondary | ICD-10-CM | POA: Diagnosis not present

## 2020-10-08 DIAGNOSIS — M9901 Segmental and somatic dysfunction of cervical region: Secondary | ICD-10-CM | POA: Diagnosis not present

## 2020-10-08 DIAGNOSIS — M9904 Segmental and somatic dysfunction of sacral region: Secondary | ICD-10-CM | POA: Diagnosis not present

## 2020-10-13 ENCOUNTER — Other Ambulatory Visit: Payer: Self-pay

## 2020-10-13 ENCOUNTER — Encounter (INDEPENDENT_AMBULATORY_CARE_PROVIDER_SITE_OTHER): Payer: Self-pay | Admitting: Ophthalmology

## 2020-10-13 ENCOUNTER — Ambulatory Visit (INDEPENDENT_AMBULATORY_CARE_PROVIDER_SITE_OTHER): Payer: Medicare HMO | Admitting: Ophthalmology

## 2020-10-13 DIAGNOSIS — H25813 Combined forms of age-related cataract, bilateral: Secondary | ICD-10-CM

## 2020-10-13 DIAGNOSIS — H3589 Other specified retinal disorders: Secondary | ICD-10-CM | POA: Diagnosis not present

## 2020-10-13 DIAGNOSIS — H35033 Hypertensive retinopathy, bilateral: Secondary | ICD-10-CM

## 2020-10-13 DIAGNOSIS — I10 Essential (primary) hypertension: Secondary | ICD-10-CM | POA: Diagnosis not present

## 2020-10-14 ENCOUNTER — Encounter (INDEPENDENT_AMBULATORY_CARE_PROVIDER_SITE_OTHER): Payer: Self-pay | Admitting: Ophthalmology

## 2020-10-19 ENCOUNTER — Ambulatory Visit: Payer: Medicare HMO | Admitting: Obstetrics & Gynecology

## 2020-10-27 DIAGNOSIS — M9903 Segmental and somatic dysfunction of lumbar region: Secondary | ICD-10-CM | POA: Diagnosis not present

## 2020-10-27 DIAGNOSIS — M5136 Other intervertebral disc degeneration, lumbar region: Secondary | ICD-10-CM | POA: Diagnosis not present

## 2020-10-27 DIAGNOSIS — M5030 Other cervical disc degeneration, unspecified cervical region: Secondary | ICD-10-CM | POA: Diagnosis not present

## 2020-10-27 DIAGNOSIS — M9901 Segmental and somatic dysfunction of cervical region: Secondary | ICD-10-CM | POA: Diagnosis not present

## 2020-10-27 DIAGNOSIS — M9904 Segmental and somatic dysfunction of sacral region: Secondary | ICD-10-CM | POA: Diagnosis not present

## 2020-11-05 ENCOUNTER — Ambulatory Visit: Payer: Medicare HMO | Admitting: Obstetrics & Gynecology

## 2020-11-08 ENCOUNTER — Encounter: Payer: Self-pay | Admitting: Obstetrics and Gynecology

## 2020-11-08 ENCOUNTER — Other Ambulatory Visit: Payer: Self-pay

## 2020-11-08 ENCOUNTER — Ambulatory Visit (INDEPENDENT_AMBULATORY_CARE_PROVIDER_SITE_OTHER): Payer: Medicare HMO | Admitting: Obstetrics and Gynecology

## 2020-11-08 ENCOUNTER — Ambulatory Visit: Payer: Medicare HMO | Admitting: Nurse Practitioner

## 2020-11-08 ENCOUNTER — Telehealth: Payer: Self-pay | Admitting: Obstetrics and Gynecology

## 2020-11-08 VITALS — BP 108/66 | HR 98 | Ht 69.5 in | Wt 189.0 lb

## 2020-11-08 DIAGNOSIS — R2231 Localized swelling, mass and lump, right upper limb: Secondary | ICD-10-CM | POA: Diagnosis not present

## 2020-11-08 NOTE — Progress Notes (Signed)
GYNECOLOGY  VISIT   HPI: 65 y.o.   Divorced  [place] American  female   [phone] with No LMP recorded. Patient is postmenopausal.   here for lump under right arm x2weeks.  Can feel it if she is lying on her side.  Thinks it is getting smaller.  It is uncomfortable when she pokes it or when she wakes up in the am and has slept on her side.   Notes both of her breasts are more swollen in general.  She wants to make dietary change to help this.  No recent illness or fever.   Received her Covid booster in November.   Mother had breast cancer.   GYNECOLOGIC HISTORY: No LMP recorded. Patient is postmenopausal. Contraception:  PMP Menopausal hormone therapy:  none Last mammogram: 04-28-20 Neg/BiRads1 - [facility]. Last pap smear: 2 years ago normal. No abnormal paps        OB History     Gravida  2   Para      Term      Preterm      AB  2   Living         SAB  2   IAB      Ectopic      Multiple      Live Births                 Patient Active Problem List   Diagnosis Date Noted   Falls 04/07/2016    Past Medical History:  Diagnosis Date   Anxiety    Depression    Fibroid    High cholesterol    Substance abuse ([place])    recovering alcohol--sober for 27 years   Thyroid disease     Past Surgical History:  Procedure Laterality Date   bartholins cyst removed  04/10/1998   cyst removed     Vagina   UTERINE FIBROID SURGERY      Current Outpatient Medications  Medication Sig Dispense Refill   B Complex Vitamins (B COMPLEX 1 PO) B Complex  Take once a day     Cholecalciferol (D3 ADULT PO) Take by mouth.     CRANBERRY PO Take by mouth.     ECHINACEA EXTRACT PO Take 1 tablet by mouth at bedtime.     Ferrous Sulfate (IRON PO) iron  Take 2 tab po twice a week     Gymnema Sylvestris Leaf POWD by Does not apply route.     MAGNESIUM CHLORIDE PO Take by mouth.     Omega-3 Fatty Acids (FISH OIL) 1000 MG CAPS Take 1 capsule by mouth at bedtime.     TURMERIC  CURCUMIN PO Take 1 tablet by mouth at bedtime.     vitamin C (ASCORBIC ACID) 500 MG tablet Take 500 mg by mouth daily. Takes 1-2 tablets per day     zinc gluconate 50 MG tablet Take 50 mg by mouth daily.     No current facility-administered medications for this visit.     ALLERGIES: Amoxicillin  Family History  Problem Relation Age of Onset   Ataxia Neg Hx     Social History   Socioeconomic History   Marital status: Divorced    Spouse name: Not on file   Number of children: 0   Years of education: Master's   Highest education level: Not on file  Occupational History   Occupation: [employer]  Tobacco Use   Smoking status: Former    Types:  Cigarettes    Quit date: 05/14/1995    Years since quitting: 25.5   Smokeless tobacco: Never  Vaping Use   Vaping Use: Never used  Substance and Sexual Activity   Alcohol use: No   Drug use: No   Sexual activity: Not Currently    Birth control/protection: Post-menopausal  Other Topics Concern   Not on file  Social History Narrative   Lives alone   Caffeine use: Drinks coffee/tea/soda ocass   Social Determinants of Health   Financial Resource Strain: Not on file  Food Insecurity: Not on file  Transportation Needs: Not on file  Physical Activity: Not on file  Stress: Not on file  Social Connections: Not on file  Intimate Partner Violence: Not on file    Review of Systems  Skin:        Lump under right arm x2weeks.  All other systems reviewed and are negative.  PHYSICAL EXAMINATION:    BP 108/66   Pulse 98   Ht 5' 9.5" (1.765 m)   Wt 189 lb (85.7 kg)   SpO2 97%   BMI 27.51 kg/m     General appearance: alert, cooperative and appears stated age Head: Normocephalic, without obvious abnormality, atraumatic Neck: no adenopathy, supple, symmetrical. Lungs: clear to auscultation bilaterally Breasts: normal appearance, no masses or tenderness, No nipple retraction or dimpling, No nipple discharge or  bleeding, No axillary or supraclavicular adenopathy on left.  Right axillary area with 4 - 5 cm fullness, nontender.  Heart: regular rate and rhythm Lymph nodes: Cervical, supraclavicular nodes normal.  Chaperone was present for exam:  Estill Bamberg, CMA  ASSESSMENT  Right axillary mass.  FH breast cancer.   PLAN  We discussed axillary masses - benign lymph nodes, malignancies, lipomas, accessory breast tissue.  Will proceed with dx right mammogram and right axillary [place].  I mentioned reduction in caffeine use to improve breast comfort.    An After Visit Summary was printed and given to the patient.

## 2020-11-08 NOTE — Telephone Encounter (Signed)
Please schedule a diagnostic right mammogram and right axillary ultrasound at [facility].  My patient had a 4 - 5 cm fullness of the right axillary region.  Her mother had breast cancer.   Her last mammogram was done 04/28/20 at [facility] and was normal, BI-RADS 1.

## 2020-11-09 NOTE — Telephone Encounter (Signed)
Patient scheduled at [facility] on 11/18/20 @ 9:30am , order faxed. Left message for patient to call.

## 2020-11-15 NOTE — Telephone Encounter (Signed)
Left message for patient to call again. 

## 2020-11-16 NOTE — Telephone Encounter (Signed)
Patient informed. 

## 2020-11-19 DIAGNOSIS — M9901 Segmental and somatic dysfunction of cervical region: Secondary | ICD-10-CM | POA: Diagnosis not present

## 2020-11-19 DIAGNOSIS — M9904 Segmental and somatic dysfunction of sacral region: Secondary | ICD-10-CM | POA: Diagnosis not present

## 2020-11-19 DIAGNOSIS — M5030 Other cervical disc degeneration, unspecified cervical region: Secondary | ICD-10-CM | POA: Diagnosis not present

## 2020-11-19 DIAGNOSIS — M9903 Segmental and somatic dysfunction of lumbar region: Secondary | ICD-10-CM | POA: Diagnosis not present

## 2020-11-19 DIAGNOSIS — M5136 Other intervertebral disc degeneration, lumbar region: Secondary | ICD-10-CM | POA: Diagnosis not present

## 2020-11-25 DIAGNOSIS — R928 Other abnormal and inconclusive findings on diagnostic imaging of breast: Secondary | ICD-10-CM | POA: Diagnosis not present

## 2020-11-25 DIAGNOSIS — N6310 Unspecified lump in the right breast, unspecified quadrant: Secondary | ICD-10-CM | POA: Diagnosis not present

## 2020-12-09 ENCOUNTER — Encounter: Payer: Self-pay | Admitting: Obstetrics and Gynecology

## 2020-12-09 DIAGNOSIS — R222 Localized swelling, mass and lump, trunk: Secondary | ICD-10-CM | POA: Diagnosis not present

## 2020-12-09 DIAGNOSIS — R2231 Localized swelling, mass and lump, right upper limb: Secondary | ICD-10-CM | POA: Diagnosis not present

## 2020-12-15 ENCOUNTER — Encounter: Payer: Self-pay | Admitting: Obstetrics and Gynecology

## 2020-12-17 ENCOUNTER — Other Ambulatory Visit: Payer: Self-pay

## 2020-12-17 ENCOUNTER — Ambulatory Visit (INDEPENDENT_AMBULATORY_CARE_PROVIDER_SITE_OTHER): Payer: Medicare HMO | Admitting: Obstetrics and Gynecology

## 2020-12-17 VITALS — BP 124/80

## 2020-12-17 DIAGNOSIS — R2231 Localized swelling, mass and lump, right upper limb: Secondary | ICD-10-CM | POA: Diagnosis not present

## 2020-12-17 NOTE — Progress Notes (Signed)
GYNECOLOGY  VISIT   HPI: 65 y.o.   Divorced  [place] American  female   [phone] with No LMP recorded. Patient is postmenopausal.   here for consult regarding Breast biopsy result.   Patient presented with right axillary mass.  She had imaging through [facility] and the ultrasound guided biopsy report from 12/09/20 is available at the time of this office visit.  Mass measured 6.9 x 6.2 x 6.6 cm. The biopsy final result showed benign soft tissue, adipose, and skeletal muscle. The result was though to be concordant with an intramuscular lipoma.    The radiologist is recommending a possible CT scan to evaluate further into her axillary area.   GYNECOLOGIC HISTORY: No LMP recorded. Patient is postmenopausal. Contraception:  PMP Menopausal hormone therapy:  none Last mammogram:  04-28-20 Last pap smear:   2020        OB History     Gravida  2   Para      Term      Preterm      AB  2   Living         SAB  2   IAB      Ectopic      Multiple      Live Births                 Patient Active Problem List   Diagnosis Date Noted   Falls 04/07/2016    Past Medical History:  Diagnosis Date   Anxiety    Depression    Fibroid    High cholesterol    Substance abuse ([place])    recovering alcohol--sober for 27 years   Thyroid disease     Past Surgical History:  Procedure Laterality Date   bartholins cyst removed  04/10/1998   cyst removed     Vagina   UTERINE FIBROID SURGERY      Current Outpatient Medications  Medication Sig Dispense Refill   B Complex Vitamins (B COMPLEX 1 PO) B Complex  Take once a day     Cholecalciferol (D3 ADULT PO) Take by mouth.     CRANBERRY PO Take by mouth.     ECHINACEA EXTRACT PO Take 1 tablet by mouth at bedtime.     Ferrous Sulfate (IRON PO) iron  Take 2 tab po twice a week     MAGNESIUM CHLORIDE PO Take by mouth.     Omega-3 Fatty Acids (FISH OIL) 1000 MG CAPS Take 1 capsule by mouth at bedtime.     TURMERIC CURCUMIN PO Take 1  tablet by mouth at bedtime.     vitamin C (ASCORBIC ACID) 500 MG tablet Take 500 mg by mouth daily. Takes 1-2 tablets per day     zinc gluconate 50 MG tablet Take 50 mg by mouth daily.     No current facility-administered medications for this visit.     ALLERGIES: Amoxicillin  Family History  Problem Relation Age of Onset   Ataxia Neg Hx     Social History   Socioeconomic History   Marital status: Divorced    Spouse name: Not on file   Number of children: 0   Years of education: Master's   Highest education level: Not on file  Occupational History   Occupation: [employer]  Tobacco Use   Smoking status: Former    Types: Cigarettes    Quit date: 05/14/1995    Years since quitting: 25.6   Smokeless tobacco: Never  Vaping Use   Vaping Use: Never used  Substance and Sexual Activity   Alcohol use: No   Drug use: No   Sexual activity: Not Currently    Birth control/protection: Post-menopausal  Other Topics Concern   Not on file  Social History Narrative   Lives alone   Caffeine use: Drinks coffee/tea/soda ocass   Social Determinants of Health   Financial Resource Strain: Not on file  Food Insecurity: Not on file  Transportation Needs: Not on file  Physical Activity: Not on file  Stress: Not on file  Social Connections: Not on file  Intimate Partner Violence: Not on file    Review of Systems  See HPI.   PHYSICAL EXAMINATION:    BP 124/80 (Cuff Size: Normal)     General appearance: alert, cooperative and appears stated age  ASSESSMENT  Right axillary mass with benign biopsy.  Incomplete imaging with ultrasound.   PLAN  Will get a copy of all reports form her diagnostic breast/axillary imaging from [facility] [place].  Will assist in scheduling CT of right axillary region.     An After Visit Summary was printed and given to the patient.  21 min  total time was spent for this patient encounter, including preparation, face-to-face counseling with  the patient, coordination of care, and documentation of the encounter.

## 2020-12-20 ENCOUNTER — Telehealth: Payer: Self-pay | Admitting: Obstetrics and Gynecology

## 2020-12-20 ENCOUNTER — Encounter: Payer: Self-pay | Admitting: Obstetrics and Gynecology

## 2020-12-20 DIAGNOSIS — M5136 Other intervertebral disc degeneration, lumbar region: Secondary | ICD-10-CM | POA: Diagnosis not present

## 2020-12-20 DIAGNOSIS — M5030 Other cervical disc degeneration, unspecified cervical region: Secondary | ICD-10-CM | POA: Diagnosis not present

## 2020-12-20 DIAGNOSIS — R2231 Localized swelling, mass and lump, right upper limb: Secondary | ICD-10-CM

## 2020-12-20 DIAGNOSIS — M9901 Segmental and somatic dysfunction of cervical region: Secondary | ICD-10-CM | POA: Diagnosis not present

## 2020-12-20 DIAGNOSIS — M9904 Segmental and somatic dysfunction of sacral region: Secondary | ICD-10-CM | POA: Diagnosis not present

## 2020-12-20 DIAGNOSIS — M9903 Segmental and somatic dysfunction of lumbar region: Secondary | ICD-10-CM | POA: Diagnosis not present

## 2020-12-20 NOTE — Telephone Encounter (Signed)
Dr. Quincy Simmonds, I see reports in her chart under Imaging for 11/25/20 ultrasound of right breast and axilla and 11/25/20 Diagnostic mammogram unilateral.  Please let me know what else you might be looking for if you have the 12/09/20 u/s guided bx report.  Thanks

## 2020-12-20 NOTE — Telephone Encounter (Signed)
Please also request imaging reports from [facility] for her right axillary mass.    I have the ultrasound guided biopsy report from 12/09/20, but do not have any other imaging reports.

## 2020-12-20 NOTE — Telephone Encounter (Signed)
Please assist in scheduling CT scan at [facility].   Patient has a right axillary mass 6.9 x 6.2 x 6.6 cm on ultrasound at [facility]. She had a biopsy that was benign soft tissue, adipose, and skeletal muscle.  The radiologist, Dr. Robbi Garter, is recommending CT for further deliniation of the mass.   I need a recommendation from [place] on the region to be included in the CT and if contrast is needed.   Please continue with patient in mammogram hold.

## 2020-12-21 NOTE — Telephone Encounter (Signed)
It looks like we have all of the documentation of her imaging.  The US guided biopsy will be scanned in to Epic.   Thank you for checking on this.

## 2020-12-24 NOTE — Telephone Encounter (Signed)
I called [facility] and was transferred to [facility] radiology spoke with [facility] and was told to put in order for CT scan with contrast and put the below in notes.   Patient provided the number to [facility] imaging to schedule.

## 2020-12-27 NOTE — Telephone Encounter (Signed)
Patient scheduled on 01/10/21, will route to [facility] to check for prior approval.

## 2020-12-27 NOTE — Telephone Encounter (Signed)
Prior Authorization initiated. Reference #: [id number]. Clinical documentation form to be faxed.

## 2020-12-30 ENCOUNTER — Other Ambulatory Visit: Payer: Self-pay

## 2020-12-30 ENCOUNTER — Telehealth: Payer: Self-pay

## 2020-12-30 NOTE — Telephone Encounter (Signed)
Clinical documentation faxed to [facility].

## 2020-12-30 NOTE — Telephone Encounter (Signed)
Patient stopped by the office earlier and [place] relayed that she had some question regarding her CT scan.  I offered to call her to discuss. I called her and left message to call triage when she can.

## 2020-12-30 NOTE — Telephone Encounter (Signed)
Robin Gilbert spoke with patient in the office. See her telephone encounter.

## 2020-12-30 NOTE — Telephone Encounter (Signed)
Patient walked in office to discuss future appointment CT scan. Patient concerned not scheduled for correct scan and Ins. Will not cover. She would like [place] to call and speak with radiologist,Dr. Truman Hayward, at [facility] and verify CT is scheduled correctly. She gave me her cell #.  Spoke with Dr.Lee and she verified patient needs CT of chest with and without contrast.  DX: new mass rt.Axilla, r/o ?Lipoma included on field of view on mammogram.  Called patient and left detailed message stating I spoke with radiologist, Dr.Lee, and CT scan is scheduled correctly.

## 2020-12-30 NOTE — Telephone Encounter (Signed)
Robin Gilbert spoke with radiologist and she wanted added to note on order "questionable lipoma incompletely included on field of view on mammogram.".  Order already placed and linked to schedule. Could not be opened to add to note. I was told to add it to the appointment notes and the tech would see if there.

## 2020-12-31 ENCOUNTER — Ambulatory Visit (INDEPENDENT_AMBULATORY_CARE_PROVIDER_SITE_OTHER): Payer: Medicare HMO | Admitting: Family Medicine

## 2020-12-31 ENCOUNTER — Encounter: Payer: Self-pay | Admitting: Family Medicine

## 2020-12-31 ENCOUNTER — Other Ambulatory Visit: Payer: Self-pay

## 2020-12-31 VITALS — BP 118/72 | HR 73 | Temp 97.8°F | Ht 69.0 in | Wt 191.0 lb

## 2020-12-31 DIAGNOSIS — Z23 Encounter for immunization: Secondary | ICD-10-CM | POA: Diagnosis not present

## 2020-12-31 DIAGNOSIS — Z1159 Encounter for screening for other viral diseases: Secondary | ICD-10-CM | POA: Diagnosis not present

## 2020-12-31 DIAGNOSIS — Z7189 Other specified counseling: Secondary | ICD-10-CM | POA: Diagnosis not present

## 2020-12-31 DIAGNOSIS — E7800 Pure hypercholesterolemia, unspecified: Secondary | ICD-10-CM | POA: Diagnosis not present

## 2020-12-31 DIAGNOSIS — Z0000 Encounter for general adult medical examination without abnormal findings: Secondary | ICD-10-CM

## 2020-12-31 DIAGNOSIS — Z1329 Encounter for screening for other suspected endocrine disorder: Secondary | ICD-10-CM | POA: Diagnosis not present

## 2020-12-31 DIAGNOSIS — Z7185 Encounter for immunization safety counseling: Secondary | ICD-10-CM

## 2020-12-31 NOTE — Patient Instructions (Signed)
  Robin Gilbert , Thank you for taking time to come for your Medicare Wellness Visit. I appreciate your ongoing commitment to your health goals. Please review the following plan we discussed and let me know if I can assist you in the future.    This is a list of the screening recommended for you and due dates:  Health Maintenance  Topic Date Due   HIV Screening  Never done   Hepatitis C Screening: USPSTF Recommendation to screen - Ages 55-66 yo.  Never done   Tetanus Vaccine  Never done   Pap Smear  Never done   Colon Cancer Screening  Never done   Zoster (Shingles) Vaccine (1 of 2) Never done   COVID-19 Vaccine (4 - Booster) 06/25/2020   Flu Shot  01/01/2024*   Mammogram  11/26/2022   DEXA scan (bone density measurement)  Completed   HPV Vaccine  Aged Out  *Topic was postponed. The date shown is not the original due date.

## 2020-12-31 NOTE — Progress Notes (Signed)
Robin Gilbert is a 65 y.o. female who presents for annual wellness visit and follow-up on chronic medical conditions.  She has the following concerns:  Recently diagnosed with an axillary mass that is believed to be benign. She is benign followed by her OB/GYN and will schedule with a breast surgeon in the near future.    Immunization History  Administered Date(s) Administered   Medtronic Booster Vaccination 02/26/2020   Pfizer Covid-19 Vaccine Bivalent Booster 71yrs & up 12/31/2020   Unspecified SARS-COV-2 Vaccination 04/23/2019, 05/16/2019   Last Pap smear: 2020 Last mammogram: 11/25/2020 Last colonoscopy: Had 3 years ago- patient will bring copy Last DEXA: 12/15/2020 Dentist: twice a year for cleanings Ophtho: 10/13/2020 - [facility] Exercise: None in 3 months   Other doctors caring for patient include: Ophthalmology: Dr. Bernarda Caffey Dr. Lewie Loron at [facility] 1-2 times a month GI- Dr. Collene Mares OB/GYN- Dr. Quincy Simmonds  Neurologist- Dr. 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    Depression screen:  See questionnaire below.  Depression screen [facility] 2/9 12/31/2020 09/23/2020  Decreased Interest 0 2  Down, Depressed, Hopeless 1 1  PHQ - 2 Score 1 3  Altered sleeping - 0  Tired, decreased energy - 1  Change in appetite - 1  Feeling bad or failure about yourself  - 0  Trouble concentrating - 0  Moving slowly or fidgety/restless - 0  Suicidal thoughts - 0  PHQ-9 Score - 5  Difficult doing work/chores - Not difficult at all    Fall Risk Screen: see questionnaire below. Fall Risk  12/31/2020 09/23/2020  Falls in the past year? 0 0  Number falls in past yr: 0 0  Injury with Fall? 0 0  Risk for fall due to : No Fall Risks No Fall Risks  Follow up Falls evaluation completed Falls evaluation completed    ADL screen:  See questionnaire below Functional Status Survey: Is the patient deaf  or have difficulty hearing?: No Does the patient have difficulty seeing, even when wearing glasses/contacts?: Yes (Having Cataract surgery) Does the patient have difficulty concentrating, remembering, or making decisions?: No Does the patient have difficulty walking or climbing stairs?: No Does the patient have difficulty dressing or bathing?: No Does the patient have difficulty doing errands alone such as visiting a doctor's office or shopping?: No   End of Life Discussion:  Patient does not have a living will and medical power of attorney. Paperwork reviewed and provided   Review of Systems Constitutional: -fever, -chills, -sweats, -unexpected weight change, -anorexia, -fatigue Allergy: -sneezing, -itching, -congestion Dermatology: denies changing moles, rash, lumps, new worrisome lesions ENT: -runny nose, -ear pain, -sore throat, -hoarseness, -sinus pain, -teeth pain, -tinnitus, -hearing loss, -epistaxis Cardiology:  -chest pain, -palpitations, -edema, -orthopnea, -paroxysmal nocturnal dyspnea Respiratory: -cough, -shortness of breath, -dyspnea on exertion, -wheezing, -hemoptysis Gastroenterology: -abdominal pain, -nausea, -vomiting, -diarrhea, -constipation, -blood in stool, -changes in bowel movement, -dysphagia Hematology: -bleeding or bruising problems Musculoskeletal: -arthralgias, -myalgias, -joint swelling, -back pain, -neck pain, -cramping, -gait changes Ophthalmology: -vision changes, -eye redness, -itching, -discharge Urology: -dysuria, -difficulty urinating, -hematuria, -urinary frequency, -urgency, incontinence Neurology: -headache, -weakness, -tingling, -numbness, -speech abnormality, -memory loss, -falls, -dizziness Psychology:  -depressed mood, -agitation, -sleep problems    PHYSICAL EXAM:  BP 118/72   Pulse 73   Temp 97.8 F (36.6 C) (Tympanic)   Ht 5\' 9"  (1.753 m)   Wt 191  lb (86.6 kg)   SpO2 98%   BMI 28.21 kg/m   General Appearance: Alert, cooperative, no  distress, appears stated age Head: Normocephalic, without obvious abnormality, atraumatic Eyes: PERRL, conjunctiva/corneas clear, EOM's intact Ears: Normal TM's and external ear canals Neck: Supple, no lymphadenopathy; thyroid: no enlargement/tenderness/nodules; no carotid bruit or JVD Back: Spine nontender, no curvature, ROM normal, no CVA tenderness Lungs: Clear to auscultation bilaterally without wheezes, rales or ronchi; respirations unlabored Heart: Regular rate and rhythm, S1 and S2 normal, no murmur, rub or gallop Abdomen: Soft, non-tender, nondistended, normoactive bowel sounds, no masses, no hepatosplenomegaly Extremities: No clubbing, cyanosis or edema Pulses: 2+ and symmetric all extremities Skin: Skin color, texture, turgor normal, no rashes or lesions Lymph nodes: Cervical, supraclavicular nodes normal Neurologic: CNII-XII intact, normal strength, sensation and gait Psych: Normal mood, affect, hygiene and grooming.  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  She is followed by her OB/GYN.  Preventive healthcare reviewed. Recommend regular dental and eye exams.  Advance directive counseling done.  Immunizations reviewed.  Hep C screening done per guidelines.  Discussed monthly self breast exams and yearly mammograms; at least 30 minutes of aerobic activity at least 5 days/week and weight-bearing exercise 2x/week; proper sunscreen use reviewed; healthy diet, including goals of calcium and vitamin D  intake and alcohol recommendations (less than or equal to 1 drink/day) reviewed; regular seatbelt use; changing batteries in smoke detectors.  Immunization recommendations discussed.  Colonoscopy recommendations reviewed   Medicare Attestation I have personally reviewed: The patient's medical and social history Their use of alcohol, tobacco or illicit drugs Their current medications and supplements The patient's functional ability including ADLs,fall risks, home safety risks, cognitive, and hearing and visual impairment Diet and physical activities Evidence for depression or mood disorders  The patient's weight, height, and BMI have been recorded in the chart.  I have made referrals, counseling, and provided education to the patient based on review of the above and I have provided the patient with a written personalized care plan for preventive services.     Harland Dingwall, NP-C   01/02/2021

## 2021-01-04 NOTE — Telephone Encounter (Signed)
PA obtained.  Encounter closed.

## 2021-01-07 ENCOUNTER — Other Ambulatory Visit: Payer: Self-pay

## 2021-01-07 ENCOUNTER — Other Ambulatory Visit: Payer: Medicare HMO

## 2021-01-07 DIAGNOSIS — Z1329 Encounter for screening for other suspected endocrine disorder: Secondary | ICD-10-CM | POA: Diagnosis not present

## 2021-01-07 DIAGNOSIS — Z0000 Encounter for general adult medical examination without abnormal findings: Secondary | ICD-10-CM | POA: Diagnosis not present

## 2021-01-07 DIAGNOSIS — E7800 Pure hypercholesterolemia, unspecified: Secondary | ICD-10-CM

## 2021-01-07 DIAGNOSIS — Z1159 Encounter for screening for other viral diseases: Secondary | ICD-10-CM | POA: Diagnosis not present

## 2021-01-08 LAB — CBC WITH DIFFERENTIAL/PLATELET
Basophils Absolute: 0 10*3/uL (ref 0.0–0.2)
Basos: 1 %
EOS (ABSOLUTE): 0.1 10*3/uL (ref 0.0–0.4)
Eos: 3 %
Hematocrit: 37.2 % (ref 34.0–46.6)
Hemoglobin: 12.4 g/dL (ref 11.1–15.9)
Immature Grans (Abs): 0 10*3/uL (ref 0.0–0.1)
Immature Granulocytes: 0 %
Lymphocytes Absolute: 2 10*3/uL (ref 0.7–3.1)
Lymphs: 46 %
MCH: 28.6 pg (ref 26.6–33.0)
MCHC: 33.3 g/dL (ref 31.5–35.7)
MCV: 86 fL (ref 79–97)
Monocytes Absolute: 0.4 10*3/uL (ref 0.1–0.9)
Monocytes: 9 %
Neutrophils Absolute: 1.7 10*3/uL (ref 1.4–7.0)
Neutrophils: 41 %
Platelets: 289 10*3/uL (ref 150–450)
RBC: 4.34 x10E6/uL (ref 3.77–5.28)
RDW: 12.8 % (ref 11.7–15.4)
WBC: 4.3 10*3/uL (ref 3.4–10.8)

## 2021-01-08 LAB — COMPREHENSIVE METABOLIC PANEL
ALT: 32 IU/L (ref 0–32)
AST: 27 IU/L (ref 0–40)
Albumin/Globulin Ratio: 1.7 (ref 1.2–2.2)
Albumin: 4.3 g/dL (ref 3.8–4.8)
Alkaline Phosphatase: 62 IU/L (ref 44–121)
BUN/Creatinine Ratio: 17 (ref 12–28)
BUN: 15 mg/dL (ref 8–27)
Bilirubin Total: 0.5 mg/dL (ref 0.0–1.2)
CO2: 24 mmol/L (ref 20–29)
Calcium: 9.7 mg/dL (ref 8.7–10.3)
Chloride: 106 mmol/L (ref 96–106)
Creatinine, Ser: 0.87 mg/dL (ref 0.57–1.00)
Globulin, Total: 2.5 g/dL (ref 1.5–4.5)
Glucose: 96 mg/dL (ref 70–99)
Potassium: 4.5 mmol/L (ref 3.5–5.2)
Sodium: 142 mmol/L (ref 134–144)
Total Protein: 6.8 g/dL (ref 6.0–8.5)
eGFR: 74 mL/min/{1.73_m2} (ref >59)

## 2021-01-08 LAB — LIPID PANEL
Chol/HDL Ratio: 3.6 ratio (ref 0.0–4.4)
Cholesterol, Total: 264 mg/dL — ABNORMAL HIGH (ref 100–199)
HDL: 73 mg/dL (ref >39)
LDL Chol Calc (NIH): 181 mg/dL — ABNORMAL HIGH (ref 0–99)
Triglycerides: 63 mg/dL (ref 0–149)
VLDL Cholesterol Cal: 10 mg/dL (ref 5–40)

## 2021-01-08 LAB — HEPATITIS C ANTIBODY: Hep C Virus Ab: <0.1 s/co ratio (ref 0.0–0.9)

## 2021-01-08 LAB — T4, FREE: Free T4: 0.91 ng/dL (ref 0.82–1.77)

## 2021-01-08 LAB — T3: T3, Total: 135 ng/dL (ref 71–180)

## 2021-01-08 LAB — TSH: TSH: 2.09 u[IU]/mL (ref 0.450–4.500)

## 2021-01-10 ENCOUNTER — Ambulatory Visit
Admission: RE | Admit: 2021-01-10 | Discharge: 2021-01-10 | Disposition: A | Discharge status: Home / Self Care | Payer: Medicare HMO | Source: Ambulatory Visit | Attending: Obstetrics and Gynecology | Admitting: Obstetrics and Gynecology

## 2021-01-10 DIAGNOSIS — I700 Atherosclerosis of aorta: Secondary | ICD-10-CM | POA: Diagnosis not present

## 2021-01-10 DIAGNOSIS — I2510 Atherosclerotic heart disease of native coronary artery without angina pectoris: Secondary | ICD-10-CM | POA: Diagnosis not present

## 2021-01-10 DIAGNOSIS — R2231 Localized swelling, mass and lump, right upper limb: Secondary | ICD-10-CM

## 2021-01-10 DIAGNOSIS — R918 Other nonspecific abnormal finding of lung field: Secondary | ICD-10-CM | POA: Diagnosis not present

## 2021-01-10 DIAGNOSIS — N281 Cyst of kidney, acquired: Secondary | ICD-10-CM | POA: Diagnosis not present

## 2021-01-10 MED ORDER — IOPAMIDOL (ISOVUE-300) INJECTION 61%
75.0000 mL | Freq: Once | INTRAVENOUS | Status: AC | PRN
  Administered 2021-01-10: 75 mL via INTRAVENOUS

## 2021-01-13 ENCOUNTER — Ambulatory Visit (INDEPENDENT_AMBULATORY_CARE_PROVIDER_SITE_OTHER): Payer: Medicare HMO | Admitting: Family Medicine

## 2021-01-13 ENCOUNTER — Encounter: Payer: Self-pay | Admitting: Family Medicine

## 2021-01-13 ENCOUNTER — Other Ambulatory Visit: Payer: Self-pay

## 2021-01-13 VITALS — BP 116/82 | HR 71 | Ht 69.0 in | Wt 187.6 lb

## 2021-01-13 DIAGNOSIS — E7800 Pure hypercholesterolemia, unspecified: Secondary | ICD-10-CM | POA: Insufficient documentation

## 2021-01-13 DIAGNOSIS — R911 Solitary pulmonary nodule: Secondary | ICD-10-CM | POA: Insufficient documentation

## 2021-01-13 DIAGNOSIS — Z9189 Other specified personal risk factors, not elsewhere classified: Secondary | ICD-10-CM | POA: Insufficient documentation

## 2021-01-13 DIAGNOSIS — D1721 Benign lipomatous neoplasm of skin and subcutaneous tissue of right arm: Secondary | ICD-10-CM | POA: Insufficient documentation

## 2021-01-13 HISTORY — DX: Pure hypercholesterolemia, unspecified: E78.00

## 2021-01-13 MED ORDER — ATORVASTATIN CALCIUM 10 MG PO TABS: 10.0000 mg | ORAL_TABLET | Freq: Every day | ORAL | 3 refills | Status: DC

## 2021-01-13 NOTE — Progress Notes (Signed)
   Subjective:    Patient ID: Robin Gilbert, female    DOB: [birth year], 65 y.o.   MRN: [id number]  HPI Chief Complaint  Patient presents with   Results    Go over CT scan   Hyperlipidemia    Discuss medication from labs last week   She is here to follow-up on significantly elevated LDL and to discuss whether she should start on a cholesterol medication.  She has never been on a statin.  She is also here to discuss recent CT scan which was ordered by her OB/GYN.  She was found to have a large lipoma in her right axilla and she is trying to determine if she would like to have surgery to remove this. A 3 mm pulmonary nodule was also seen on CT and she is concerned about what to do for this.  She is a former smoker but quit many years ago.     Review of Systems Pertinent positives and negatives in the history of present illness.     Objective:   Physical Exam BP 116/82 (BP Location: Left Arm)   Pulse 71   Ht 5\' 9"  (1.753 m)   Wt 187 lb 9.6 oz (85.1 kg)   SpO2 98%   BMI 27.70 kg/m   Alert and oriented and in no acute distress.  Respirations unlabored.  Normal speech, mood and thought process.      Assessment & Plan:  Pure hypercholesterolemia - Plan: atorvastatin (LIPITOR) 10 MG tablet, CT CARDIAC SCORING ([facility] LOCATIONS ONLY)  Risk of myocardial infarction or stroke 7.5% or greater in next 10 years - Plan: atorvastatin (LIPITOR) 10 MG tablet, CT CARDIAC SCORING ([facility] LOCATIONS ONLY)  Lipoma of right axilla  Pulmonary nodule less than 6 mm determined by computed tomography of lung  Reviewed lab results including fasting lipids and LDL 181.  Fortunately her HDL is in a good range.  We calculated her 10-year ASCVD cardiac risk which is 7.5%.  We discussed risk factors for heart disease.  She is amenable to starting on a low-dose statin 3 days/week.  Discussed taking co-Q10 if she has any side effects.  She is aware that she should have her fasting lipids rechecked in 6  weeks. She would also like to have CT coronary artery test.  This was ordered. Lipoma of the right axilla does bother her, I recommend she ask her OB/GYN for the general surgery consult to further discuss removal. We also discussed CT result regarding 3 mm pulmonary nodule.  Discussed that she appears to be low risk for lung cancer.  I do recommend she discuss this with her PCP next year.  Answered all of her questions.

## 2021-01-14 ENCOUNTER — Telehealth: Payer: Self-pay | Admitting: *Deleted

## 2021-01-14 NOTE — Telephone Encounter (Signed)
Patient informed. Referral staff message sent to [facility] they will call to schedule patient aware. Patient had visit with PCP and she is aware and recommended patient follow up with [facility].

## 2021-01-14 NOTE — Telephone Encounter (Signed)
-----   Message from Nunzio Cobbs, MD sent at 01/12/2021  7:35 PM EDT ----- Please contact patient in follow up to her CT of the chest.  It showed a fat containing mass of the axillary region consistent with a lipoma.  I will be happy to make a referral to a general surgeon at [facility] if she would like to pursue removal.   Please also send a copy of this report to her PCP for further recommendations regarding her solid pulmonary nodule that measures 3 mm.  Her PCP will need to determine if she needs further follow up.  An appointment with her PCP for review of the finding may be needed.

## 2021-01-14 NOTE — Telephone Encounter (Signed)
Pt sch 01/28/21 @ 11 am with Dr. Brantley Stage. She is aware of appt

## 2021-02-04 ENCOUNTER — Ambulatory Visit
Admission: RE | Admit: 2021-02-04 | Discharge: 2021-02-04 | Disposition: A | Discharge status: Home / Self Care | Payer: No Typology Code available for payment source | Source: Ambulatory Visit | Attending: Family Medicine | Admitting: Family Medicine

## 2021-02-04 DIAGNOSIS — Z9189 Other specified personal risk factors, not elsewhere classified: Secondary | ICD-10-CM

## 2021-02-04 DIAGNOSIS — E7800 Pure hypercholesterolemia, unspecified: Secondary | ICD-10-CM

## 2021-02-04 NOTE — Progress Notes (Signed)
I released results to pt.  Looks like Robin Gilbert started her on statin, told her to come back for labs, but there are no lab visits or lab orders.  She needs to have LFT's and lipid panel drawn checked at a lab visit in 2 months.  Please enter orders and schedule fasting lab visit (dx pure hypercholesterolemia, med monitor).  I believe they already discussed the pulmonary nodules, in discussing a CT ordered by GYN, so don't think she should have any questions.  If she isn't tolerating the statin, or if the labs come back abnormal, or if she has any questions, she should schedule a visit with one of the providers.

## 2021-02-13 DIAGNOSIS — I700 Atherosclerosis of aorta: Secondary | ICD-10-CM | POA: Insufficient documentation

## 2021-02-13 NOTE — Progress Notes (Signed)
Chief Complaint  Patient presents with   Follow-up    Follow up on CT.     Patient presents to discuss her CT cardiac score and cholesterol.  CT cardiac scan was performed 01/2021: IMPRESSION: 1. Coronary calcium score is 0. 2. Aortic Atherosclerosis (ICD10-I70.0). Small amount of calcium at the aortic root. 3. Small pulmonary nodules in the visualized lungs, largest measures 4 mm in the right lower lobe. These small nodules are indeterminate. No follow-up needed if patient is low-risk (and has no known or suspected primary neoplasm). Non-contrast chest CT can be considered in 12 months if patient is high-risk.  (She had a chest CT earlier in the month, ordered by GYN, showed axillary lipoma.  Nodule in RLL was noted, though noted at 70mm)  Lab Results  Component Value Date   CHOL 264 (H) 01/07/2021   HDL 73 01/07/2021   LDLCALC 181 (H) 01/07/2021   TRIG 63 01/07/2021   CHOLHDL 3.6 01/07/2021   She saw Vickie 10/6 to discuss the lipid results, and also discussed the pulmonary nodule on the CT ordered by GYN. 10-year ASCVD cardiac risk was 7.5%, and her notes state that she as amenable to starting low dose statin 3x/week. Patient was started on 10mg  of atorvastatin to treat her lipids, and was taking it daily (not 3x/week), as instructions stated on the bottle.  Patient only took it for 4 days, stopped due to getting nosebleeds.  She took CoQ10 along with it.  Did not have myalgias at the time.  Current diet: Eats 4-6 eggs/week.  Buttered popcorn, otherwise uses olive oil. Cheeseburger 1/week with fries. She is trying to cut back on the dairy.  She is also complaining of a chronic cough, off and on for a couple of years.  Wonders if the lung nodules on CT have anything to do with it. Dairy, as well as crackers/bread (gluten?) seem to trigger the cough.  She thinks it might be seasonal.  Does sometimes get up some clear phlegm. She denies heartburn.   PMH, PSH, SH  reviewed  Outpatient Encounter Medications as of 02/14/2021  Medication Sig Note   B Complex Vitamins (B COMPLEX 1 PO) B Complex  Take once a day    TURMERIC CURCUMIN PO Take 1 tablet by mouth at bedtime.    vitamin C (ASCORBIC ACID) 500 MG tablet Take 500 mg by mouth daily. Takes 1-2 tablets per day    zinc gluconate 50 MG tablet Take 50 mg by mouth daily.    atorvastatin (LIPITOR) 10 MG tablet Take 1 tablet (10 mg total) by mouth daily. (Patient not taking: Reported on 02/14/2021) 02/14/2021: Caused nosebleeds   Cholecalciferol (D3 ADULT PO) Take by mouth. (Patient not taking: Reported on 02/14/2021) 02/14/2021: Ran out   CRANBERRY PO Take by mouth. (Patient not taking: Reported on 02/14/2021) 02/14/2021: Ran out   ECHINACEA EXTRACT PO Take 1 tablet by mouth at bedtime. (Patient not taking: Reported on 02/14/2021) 02/14/2021: Ran out   Ferrous Sulfate (IRON PO) iron  Take 2 tab po twice a week (Patient not taking: Reported on 02/14/2021) 02/14/2021: Ran out   MAGNESIUM CHLORIDE PO Take by mouth. (Patient not taking: Reported on 02/14/2021) 02/14/2021: Ran out   Omega-3 Fatty Acids (FISH OIL) 1000 MG CAPS Take 1 capsule by mouth at bedtime. (Patient not taking: Reported on 02/14/2021) 02/14/2021: Ran out   No facility-administered encounter medications on file as of 02/14/2021.   Allergies  Allergen Reactions   Amoxicillin Other (See Comments)  Pt states she had to take probiotics when she took this. Did not agree with her   ROS: no fever, chills, URI symptoms, headaches, dizziness, chest pain. Nosebleeds resolved upon stopping the statin.  Denies congestion. Cough per HPI. No shortness of breath. No n/v/d or heartburn. No other complaints/concerns.   PHYSICAL EXAM:  BP 120/74   Pulse 84   Ht 5\' 9"  (1.753 m)   Wt 193 lb 12.8 oz (87.9 kg)   BMI 28.62 kg/m    Wt Readings from Last 3 Encounters:  02/14/21 193 lb 12.8 oz (87.9 kg)  01/13/21 187 lb 9.6 oz (85.1 kg)  12/31/20 191 lb (86.6 kg)    Well-appearing, pleasant female, in good spirits HEENT: conjunctiva and sclera are clear, EOMI.  Nasal mucosa without significant edema, no drainage. OP is clear, sinuses nontender. TM's normal. Neck: no lymphadenopathy Heart: regular rate and rhythm Lungs: clear bilaterally Abdomen: soft, nontender, no organomegaly or mass Extremities:  no edema Psych: normal mood, affect, hygiene and grooming Neuro: alert and oriented, cranial nerves are grossly intact. Normal gait, strength   ASSESSMENT/PLAN:  Pure hypercholesterolemia - reviewed low cholesterol diet, risks/SE of statins. Retry lipitor daily, add CoQ10 only if myalgias. Contact us to change if can't tolerate - Plan: Lipid panel  Aortic atherosclerosis (HCC) - mild, but present. Reviewed what this means, and the indication for statin use  Pulmonary nodule less than 6 mm determined by computed tomography of lung - likely benign, not at high risk. Reassured that her cough is not related  Medication monitoring encounter - Plan: Lipid panel, Hepatic function panel  Cough, unspecified type - intermittent, chronic.  Ddx reviewed.  Suspect PND/allergies, but also discussed GERD. Pt reassured not related ot the small nodule on CT  Needs flu shot   2 months--fasting lab visit Lipids, LFT

## 2021-02-14 ENCOUNTER — Ambulatory Visit (INDEPENDENT_AMBULATORY_CARE_PROVIDER_SITE_OTHER): Payer: Medicare HMO | Admitting: Family Medicine

## 2021-02-14 ENCOUNTER — Encounter: Payer: Self-pay | Admitting: Family Medicine

## 2021-02-14 ENCOUNTER — Other Ambulatory Visit: Payer: Self-pay

## 2021-02-14 VITALS — BP 120/74 | HR 84 | Ht 69.0 in | Wt 193.8 lb

## 2021-02-14 DIAGNOSIS — E7800 Pure hypercholesterolemia, unspecified: Secondary | ICD-10-CM | POA: Diagnosis not present

## 2021-02-14 DIAGNOSIS — Z5181 Encounter for therapeutic drug level monitoring: Secondary | ICD-10-CM | POA: Diagnosis not present

## 2021-02-14 DIAGNOSIS — R059 Cough, unspecified: Secondary | ICD-10-CM | POA: Diagnosis not present

## 2021-02-14 DIAGNOSIS — Z23 Encounter for immunization: Secondary | ICD-10-CM | POA: Diagnosis not present

## 2021-02-14 DIAGNOSIS — I700 Atherosclerosis of aorta: Secondary | ICD-10-CM

## 2021-02-14 DIAGNOSIS — R911 Solitary pulmonary nodule: Secondary | ICD-10-CM | POA: Diagnosis not present

## 2021-02-14 NOTE — Patient Instructions (Signed)
Re-try the 10mg  atorvastatin, taking it daily. You don't need to take the Coenzyme Q10 unless you get side effects (muscle aches). If those develop, the CoQ10 will likely help.  I doubt the nosebleeds are from the atorvastatin.  Feel free to use frequent nasal saline spray if your nose is dry (especially as the heat kicks on).  If, however, you clearly have recurrent nosebleeds from the atorvastatin, let me know and we will change it to a different statin (rosuvastatin/Crestor)

## 2021-02-16 NOTE — Addendum Note (Signed)
Addended by: Carolee Rota F on: 02/16/2021 08:14 AM   Modules accepted: Orders

## 2021-02-21 DIAGNOSIS — R2231 Localized swelling, mass and lump, right upper limb: Secondary | ICD-10-CM | POA: Diagnosis not present

## 2021-02-23 DIAGNOSIS — M5030 Other cervical disc degeneration, unspecified cervical region: Secondary | ICD-10-CM | POA: Diagnosis not present

## 2021-02-23 DIAGNOSIS — M9903 Segmental and somatic dysfunction of lumbar region: Secondary | ICD-10-CM | POA: Diagnosis not present

## 2021-02-23 DIAGNOSIS — M9901 Segmental and somatic dysfunction of cervical region: Secondary | ICD-10-CM | POA: Diagnosis not present

## 2021-02-23 DIAGNOSIS — M5136 Other intervertebral disc degeneration, lumbar region: Secondary | ICD-10-CM | POA: Diagnosis not present

## 2021-02-23 DIAGNOSIS — M9904 Segmental and somatic dysfunction of sacral region: Secondary | ICD-10-CM | POA: Diagnosis not present

## 2021-03-10 NOTE — Progress Notes (Deleted)
65 y.o. G1P0020 Divorced [place] American female here for annual exam.    PCP: Harland Dingwall, PA-C    No LMP recorded. Patient is postmenopausal.           Sexually active: {yes no:314532}  The current method of family planning PM.   Exercising: {yes no:314532}  {types:19826} Smoker:  {YES NO:22349}  Health Maintenance: Pap:  *** History of abnormal Pap:  {YES NO:22349} MMG:  11/25/20- Diag Rt breast-incomplete, rec [place] results if done, not in system.? Rt breast Bx 12/09/20- benign  Colonoscopy:  *** BMD: ?  Result  *** TDaP:  *** Gardasil:   {YES NO:22349} HIV: Hep C: 01/07/21-neg  Screening Labs:  Hb today: ***, Urine today: ***   reports that she quit smoking about 25 years ago. Her smoking use included cigarettes. She has never used smokeless tobacco. She reports that she does not drink alcohol and does not use drugs.  Past Medical History:  Diagnosis Date   Anxiety    Depression    Fibroid    High cholesterol    Substance abuse ([place])    recovering alcohol--sober for 27 years   Thyroid disease     Past Surgical History:  Procedure Laterality Date   bartholins cyst removed  04/10/1998   cyst removed     Vagina   UTERINE FIBROID SURGERY      Current Outpatient Medications  Medication Sig Dispense Refill   atorvastatin (LIPITOR) 10 MG tablet Take 1 tablet (10 mg total) by mouth daily. (Patient not taking: Reported on 02/14/2021) 90 tablet 3   B Complex Vitamins (B COMPLEX 1 PO) B Complex  Take once a day     Cholecalciferol (D3 ADULT PO) Take by mouth. (Patient not taking: Reported on 02/14/2021)     CRANBERRY PO Take by mouth. (Patient not taking: Reported on 02/14/2021)     ECHINACEA EXTRACT PO Take 1 tablet by mouth at bedtime. (Patient not taking: Reported on 02/14/2021)     Ferrous Sulfate (IRON PO) iron  Take 2 tab po twice a week (Patient not taking: Reported on 02/14/2021)     MAGNESIUM CHLORIDE PO Take by mouth. (Patient not taking: Reported on 02/14/2021)      Omega-3 Fatty Acids (FISH OIL) 1000 MG CAPS Take 1 capsule by mouth at bedtime. (Patient not taking: Reported on 02/14/2021)     TURMERIC CURCUMIN PO Take 1 tablet by mouth at bedtime.     vitamin C (ASCORBIC ACID) 500 MG tablet Take 500 mg by mouth daily. Takes 1-2 tablets per day     zinc gluconate 50 MG tablet Take 50 mg by mouth daily.     No current facility-administered medications for this visit.    Family History  Problem Relation Age of Onset   Ataxia Neg Hx     Review of Systems  Exam:   There were no vitals taken for this visit.    General appearance: alert, cooperative and appears stated age Head: normocephalic, without obvious abnormality, atraumatic Neck: no adenopathy, supple, symmetrical, trachea midline and thyroid normal to inspection and palpation Lungs: clear to auscultation bilaterally Breasts: normal appearance, no masses or tenderness, No nipple retraction or dimpling, No nipple discharge or bleeding, No axillary adenopathy Heart: regular rate and rhythm Abdomen: soft, non-tender; no masses, no organomegaly Extremities: extremities normal, atraumatic, no cyanosis or edema Skin: skin color, texture, turgor normal. No rashes or lesions Lymph nodes: cervical, supraclavicular, and axillary nodes normal. Neurologic: grossly normal  Pelvic: External  genitalia:  no lesions              No abnormal inguinal nodes palpated.              Urethra:  normal appearing urethra with no masses, tenderness or lesions              Bartholins and Skenes: normal                 Vagina: normal appearing vagina with normal color and discharge, no lesions              Cervix: no lesions              Pap taken: {yes no:314532} Bimanual Exam:  Uterus:  normal size, contour, position, consistency, mobility, non-tender              Adnexa: no mass, fullness, tenderness              Rectal exam: {yes no:314532}.  Confirms.              Anus:  normal sphincter tone, no  lesions  Chaperone was present for exam:  ***  Assessment:   Well woman visit with gynecologic exam.   Plan: Mammogram screening discussed. Self breast awareness reviewed. Pap and HR HPV as above. Guidelines for Calcium, Vitamin D, regular exercise program including cardiovascular and weight bearing exercise.   Follow up annually and prn.   Additional counseling given.  {yes [id number]. _______ minutes face to face time of which over 50% was spent in counseling.    After visit summary provided.

## 2021-03-18 ENCOUNTER — Ambulatory Visit: Payer: Medicare HMO | Admitting: Obstetrics and Gynecology

## 2021-04-15 DIAGNOSIS — M9902 Segmental and somatic dysfunction of thoracic region: Secondary | ICD-10-CM | POA: Diagnosis not present

## 2021-04-15 DIAGNOSIS — M9904 Segmental and somatic dysfunction of sacral region: Secondary | ICD-10-CM | POA: Diagnosis not present

## 2021-04-15 DIAGNOSIS — F331 Major depressive disorder, recurrent, moderate: Secondary | ICD-10-CM | POA: Diagnosis not present

## 2021-04-15 DIAGNOSIS — M5136 Other intervertebral disc degeneration, lumbar region: Secondary | ICD-10-CM | POA: Diagnosis not present

## 2021-04-15 DIAGNOSIS — M9901 Segmental and somatic dysfunction of cervical region: Secondary | ICD-10-CM | POA: Diagnosis not present

## 2021-04-15 DIAGNOSIS — M9903 Segmental and somatic dysfunction of lumbar region: Secondary | ICD-10-CM | POA: Diagnosis not present

## 2021-04-15 DIAGNOSIS — M5030 Other cervical disc degeneration, unspecified cervical region: Secondary | ICD-10-CM | POA: Diagnosis not present

## 2021-04-20 ENCOUNTER — Encounter (INDEPENDENT_AMBULATORY_CARE_PROVIDER_SITE_OTHER): Payer: Medicare HMO | Admitting: Ophthalmology

## 2021-04-21 DIAGNOSIS — F331 Major depressive disorder, recurrent, moderate: Secondary | ICD-10-CM | POA: Diagnosis not present

## 2021-04-22 DIAGNOSIS — M9903 Segmental and somatic dysfunction of lumbar region: Secondary | ICD-10-CM | POA: Diagnosis not present

## 2021-04-22 DIAGNOSIS — M5030 Other cervical disc degeneration, unspecified cervical region: Secondary | ICD-10-CM | POA: Diagnosis not present

## 2021-04-22 DIAGNOSIS — M9904 Segmental and somatic dysfunction of sacral region: Secondary | ICD-10-CM | POA: Diagnosis not present

## 2021-04-22 DIAGNOSIS — M9902 Segmental and somatic dysfunction of thoracic region: Secondary | ICD-10-CM | POA: Diagnosis not present

## 2021-04-22 DIAGNOSIS — M5136 Other intervertebral disc degeneration, lumbar region: Secondary | ICD-10-CM | POA: Diagnosis not present

## 2021-04-22 DIAGNOSIS — M9901 Segmental and somatic dysfunction of cervical region: Secondary | ICD-10-CM | POA: Diagnosis not present

## 2021-04-26 ENCOUNTER — Other Ambulatory Visit: Payer: Medicare HMO

## 2021-04-29 ENCOUNTER — Other Ambulatory Visit: Payer: Medicare HMO

## 2021-04-29 ENCOUNTER — Other Ambulatory Visit: Payer: Self-pay

## 2021-04-29 DIAGNOSIS — E7800 Pure hypercholesterolemia, unspecified: Secondary | ICD-10-CM

## 2021-04-29 DIAGNOSIS — Z5181 Encounter for therapeutic drug level monitoring: Secondary | ICD-10-CM

## 2021-04-30 LAB — HEPATIC FUNCTION PANEL
ALT: 25 IU/L (ref 0–32)
AST: 22 IU/L (ref 0–40)
Albumin: 4.4 g/dL (ref 3.8–4.8)
Alkaline Phosphatase: 64 IU/L (ref 44–121)
Bilirubin Total: 0.9 mg/dL (ref 0.0–1.2)
Bilirubin, Direct: 0.25 mg/dL (ref 0.00–0.40)
Total Protein: 6.7 g/dL (ref 6.0–8.5)

## 2021-04-30 LAB — LIPID PANEL
Chol/HDL Ratio: 2.2 ratio (ref 0.0–4.4)
Cholesterol, Total: 187 mg/dL (ref 100–199)
HDL: 84 mg/dL (ref >39)
LDL Chol Calc (NIH): 93 mg/dL (ref 0–99)
Triglycerides: 52 mg/dL (ref 0–149)
VLDL Cholesterol Cal: 10 mg/dL (ref 5–40)

## 2021-05-05 DIAGNOSIS — F331 Major depressive disorder, recurrent, moderate: Secondary | ICD-10-CM | POA: Diagnosis not present

## 2021-05-31 DIAGNOSIS — H35011 Changes in retinal vascular appearance, right eye: Secondary | ICD-10-CM | POA: Diagnosis not present

## 2021-05-31 DIAGNOSIS — H2513 Age-related nuclear cataract, bilateral: Secondary | ICD-10-CM | POA: Diagnosis not present

## 2021-06-07 NOTE — Progress Notes (Signed)
66 y.o. [id number] Divorced [place] American female here for annual breast and pelvic exam.   ? ?Has a right axillary lipoma.  ?Saw general surgeon Dr. Brantley Stage and will do observational management.  ? ?Will be having bilateral cataract surgery. ? ?PCP: Billey Gosling, MD ? ?No LMP recorded. Patient is postmenopausal.     ?  ?    ?Sexually active: No.  ?The current method of family planning is post menopausal status.    ?Exercising: No.  The patient does not participate in regular exercise at present. ?Smoker:  Former ? ?Health Maintenance: ?Pap:  2020 normal per patient ?History of abnormal Pap:  no ?MMG:  11-25-20 See Epic.  She prefers to do her bilateral mammogram in August.  ?Colonoscopy:  2019 normal;next 10 years ?BMD:  years ago  Result :Normal.  She will schedule.    ?TDaP:  PCP ?Gardasil:   n/a ?HIV:  years ago--Neg ?Hep C: 01-07-21 Neg ?Screening Labs:  PCP ? ? reports that she quit smoking about 26 years ago. Her smoking use included cigarettes. She has never used smokeless tobacco. She reports that she does not drink alcohol and does not use drugs. ? ?Past Medical History:  ?Diagnosis Date  ? Anxiety   ? Depression   ? Fibroid   ? High cholesterol   ? Substance abuse ([place])   ? recovering alcohol--sober for 27 years  ? Thyroid disease   ? ? ?Past Surgical History:  ?Procedure Laterality Date  ? bartholins cyst removed  04/10/1998  ? cyst removed    ? Vagina  ? UTERINE FIBROID SURGERY    ? ? ?Current Outpatient Medications  ?Medication Sig Dispense Refill  ? atorvastatin (LIPITOR) 10 MG tablet Take 1 tablet (10 mg total) by mouth daily. 90 tablet 3  ? B Complex Vitamins (B COMPLEX 1 PO) B Complex ? Take once a day    ? ECHINACEA EXTRACT PO Take 1 tablet by mouth at bedtime.    ? Ferrous Sulfate (IRON PO)     ? Omega-3 Fatty Acids (FISH OIL) 1000 MG CAPS Take 1 capsule by mouth at bedtime.    ? Prednisol Ace-Moxiflox-Bromfen 1-0.5-0.075 % SUSP Place 1 drop into the right eye 4 (four) times daily.    ? TURMERIC  CURCUMIN PO Take 1 tablet by mouth at bedtime.    ? vitamin C (ASCORBIC ACID) 500 MG tablet Take 500 mg by mouth daily. Takes 1-2 tablets per day    ? zinc gluconate 50 MG tablet Take 50 mg by mouth daily.    ? ?No current facility-administered medications for this visit.  ? ? ?Family History  ?Problem Relation Age of Onset  ? Ataxia Neg Hx   ? ? ?Review of Systems  ?All other systems reviewed and are negative. ? ?Exam:   ?BP 122/66   Pulse 88   Ht 5' 8.5" (1.74 m)   Wt 188 lb (85.3 kg)   SpO2 98%   BMI 28.17 kg/m?     ?General appearance: alert, cooperative and appears stated age ?Head: normocephalic, without obvious abnormality, atraumatic ?Neck: no adenopathy, supple, symmetrical, trachea midline and thyroid normal to inspection and palpation ?Lungs: clear to auscultation bilaterally ?Breasts: left - normal appearance, no masses or tenderness, No nipple retraction or dimpling, No nipple discharge or bleeding, No axillary adenopathy ?Right - normal appearance, no masses or tenderness, No nipple retraction or dimpling, No nipple discharge or bleeding, 5 cm axillary soft and nontender mass. ?Heart: regular rate and rhythm ?  Abdomen: soft, non-tender; no masses, no organomegaly ?Extremities: extremities normal, atraumatic, no cyanosis or edema ?Skin: skin color, texture, turgor normal. No rashes or lesions ?Lymph nodes: cervical, supraclavicular, and axillary nodes normal. ?Neurologic: grossly normal ? ?Pelvic: External genitalia:  no lesions ?             No abnormal inguinal nodes palpated. ?             Urethra:  normal appearing urethra with no masses, tenderness or lesions ?             Bartholins and Skenes: normal    ?             Vagina: normal appearing vagina with normal color and discharge, no lesions ?             Cervix: no lesions ?             Pap taken: yes ?Bimanual Exam:  Uterus:  normal size, contour, position, consistency, mobility, non-tender ?             Adnexa: no mass, fullness,  tenderness ?             Rectal exam: yes.  Confirms. ?             Anus:  normal sphincter tone, no lesions ? ?Chaperone was present for exam:  yes. ? ?Assessment:   ?Well woman visit with gynecologic exam. ?Right axillary lipoma.  ?FH breast cancer in mother. ?Cervical caner screening. ?Nutrition counseling. ? ?Plan: ?Mammogram screening discussed.  No diagnotic imaging is needed as patient has already had biopsy proven lipoma. ?Self breast awareness reviewed. ?Observational management of axillary lipoma. ?Pap and HR HPV as above. ?Guidelines for Calcium, Vitamin D, regular exercise program including cardiovascular and weight bearing exercise. ?Follow up annually and prn.  ? ?After visit summary provided.  ? ?29 min  total time was spent for this patient encounter, including preparation, face-to-face counseling with the patient, coordination of care, and documentation of the encounter. ? ? ? ? ?

## 2021-06-08 ENCOUNTER — Encounter: Payer: Self-pay | Admitting: Obstetrics and Gynecology

## 2021-06-08 ENCOUNTER — Other Ambulatory Visit: Payer: Self-pay

## 2021-06-08 ENCOUNTER — Ambulatory Visit (INDEPENDENT_AMBULATORY_CARE_PROVIDER_SITE_OTHER): Payer: Medicare HMO | Admitting: Obstetrics and Gynecology

## 2021-06-08 ENCOUNTER — Other Ambulatory Visit (HOSPITAL_COMMUNITY)
Admission: RE | Admit: 2021-06-08 | Discharge: 2021-06-08 | Disposition: A | Discharge status: Home / Self Care | Payer: Medicare HMO | Source: Ambulatory Visit | Attending: Obstetrics and Gynecology | Admitting: Obstetrics and Gynecology

## 2021-06-08 VITALS — BP 122/66 | HR 88 | Ht 68.5 in | Wt 188.0 lb

## 2021-06-08 DIAGNOSIS — Z124 Encounter for screening for malignant neoplasm of cervix: Secondary | ICD-10-CM | POA: Insufficient documentation

## 2021-06-08 DIAGNOSIS — Z1151 Encounter for screening for human papillomavirus (HPV): Secondary | ICD-10-CM | POA: Diagnosis not present

## 2021-06-08 DIAGNOSIS — D1721 Benign lipomatous neoplasm of skin and subcutaneous tissue of right arm: Secondary | ICD-10-CM

## 2021-06-08 DIAGNOSIS — Z01419 Encounter for gynecological examination (general) (routine) without abnormal findings: Secondary | ICD-10-CM

## 2021-06-08 DIAGNOSIS — Z713 Dietary counseling and surveillance: Secondary | ICD-10-CM

## 2021-06-08 NOTE — Patient Instructions (Addendum)
EXERCISE AND DIET:  We recommended that you start or continue a regular exercise program for good health. Regular exercise means any activity that makes your heart beat faster and makes you sweat.  We recommend exercising at least 30 minutes per day at least 3 days a week, preferably 4 or 5.  We also recommend a diet low in fat and sugar.  Inactivity, poor dietary choices and obesity can cause diabetes, heart attack, stroke, and kidney damage, among others.    ALCOHOL AND SMOKING:  Women should limit their alcohol intake to no more than 7 drinks/beers/glasses of wine (combined, not each!) per week. Moderation of alcohol intake to this level decreases your risk of breast cancer and liver damage. And of course, no recreational drugs are part of a healthy lifestyle.  And absolutely no smoking or even second hand smoke. Most people know smoking can cause heart and lung diseases, but did you know it also contributes to weakening of your bones? Aging of your skin?  Yellowing of your teeth and nails?  CALCIUM AND VITAMIN D:  Adequate intake of calcium and Vitamin D are recommended.  The recommendations for exact amounts of these supplements seem to change often, but generally speaking 1200 mg of calcium (either carbonate or citrate) and 600 units of Vitamin D per day seems prudent. Certain women may benefit from higher intake of Vitamin D.  If you are among these women, your doctor will have told you during your visit.    PAP SMEARS:  Pap smears, to check for cervical cancer or precancers,  have traditionally been done yearly, although recent scientific advances have shown that most women can have pap smears less often.  However, every woman still should have a physical exam from her gynecologist every year. It will include a breast check, inspection of the vulva and vagina to check for abnormal growths or skin changes, a visual exam of the cervix, and then an exam to evaluate the size and shape of the uterus and  ovaries.  And after 67 years of age, a rectal exam is indicated to check for rectal cancers. We will also provide age appropriate advice regarding health maintenance, like when you should have certain vaccines, screening for sexually transmitted diseases, bone density testing, colonoscopy, mammograms, etc.   MAMMOGRAMS:  All women over 60 years old should have a yearly mammogram. Many facilities now offer a "3D" mammogram, which may cost around $50 extra out of pocket. If possible,  we recommend you accept the option to have the 3D mammogram performed.  It both reduces the number of women who will be called back for extra views which then turn out to be normal, and it is better than the routine mammogram at detecting truly abnormal areas.    COLONOSCOPY:  Colonoscopy to screen for colon cancer is recommended for all women at age 31.  We know, you hate the idea of the prep.  We agree, BUT, having colon cancer and not knowing it is worse!!  Colon cancer so often starts as a polyp that can be seen and removed at colonscopy, which can quite literally save your life!  And if your first colonoscopy is normal and you have no family history of colon cancer, most women don't have to have it again for 10 years.  Once every ten years, you can do something that may end up saving your life, right?  We will be happy to help you get it scheduled when you are ready.  Be sure to check your insurance coverage so you understand how much it will cost.  It may be covered as a preventative service at no cost, but you should check your particular policy.       Calcium Content in Foods Calcium is the most abundant mineral in the body. Most of the body's calcium supply is stored in bones and teeth. Calcium helps many parts of the body function normally, including: Blood and blood vessels. Nerves. Hormones. Muscles. Bones and teeth. When your calcium stores are low, you may be at risk for low bone mass, bone loss, and broken  bones (fractures). When you get enough calcium, it helps to support strong bones and teeth throughout your life. Calcium is especially important for: Children during growth spurts. Girls during adolescence. Women who are pregnant or breastfeeding. Women after their menstrual cycle stops (postmenopause). Women whose menstrual cycle has stopped due to anorexia nervosa or regular intense exercise. People who cannot eat or digest dairy products. Vegans. Recommended daily amounts of calcium: Women (ages 26 to 74): 1,000 mg per day. Women (ages 69 and older): 1,200 mg per day. Men (ages 52 to 21): 1,000 mg per day. Men (ages 35 and older): 1,200 mg per day. Women (ages 63 to 25): 1,300 mg per day. Men (ages 74 to 69): 1,300 mg per day. General information Eat foods that are high in calcium. Try to get most of your calcium from food. Some people may benefit from taking calcium supplements. Check with your health care provider or diet and nutrition specialist (dietitian) before starting any calcium supplements. Calcium supplements may interact with certain medicines. Too much calcium may cause other health problems, such as constipation and kidney stones. For the body to absorb calcium, it needs vitamin D. Sources of vitamin D include: Skin exposure to direct sunlight. Foods, such as egg yolks, liver, mushrooms, saltwater fish, and fortified milk. Vitamin D supplements. Check with your health care provider or dietitian before starting any vitamin D supplements. What foods are high in calcium? Foods that are high in calcium contain more than 100 milligrams per serving. Fruits Fortified orange juice or other fruit juice, 300 mg per 8 oz serving. Vegetables Collard greens, 360 mg per 8 oz serving. Kale, 100 mg per 8 oz serving. Bok choy, 160 mg per 8 oz serving. Grains Fortified ready-to-eat cereals, 100 to 1,000 mg per 8 oz serving. Fortified frozen waffles, 200 mg in 2 waffles. Oatmeal, 140 mg  in 1 cup. Meats and other proteins Sardines, canned with bones, 325 mg per 3 oz serving. Salmon, canned with bones, 180 mg per 3 oz serving. Canned shrimp, 125 mg per 3 oz serving. Baked beans, 160 mg per 4 oz serving. Tofu, firm, made with calcium sulfate, 253 mg per 4 oz serving. Dairy Yogurt, plain, low-fat, 310 mg per 6 oz serving. Nonfat milk, 300 mg per 8 oz serving. American cheese, 195 mg per 1 oz serving. Cheddar cheese, 205 mg per 1 oz serving. Cottage cheese 2%, 105 mg per 4 oz serving. Fortified soy, rice, or almond milk, 300 mg per 8 oz serving. Mozzarella, part skim, 210 mg per 1 oz serving. The items listed above may not be a complete list of foods high in calcium. Actual amounts of calcium may be different depending on processing. Contact a dietitian for more information. What foods are lower in calcium? Foods that are lower in calcium contain 50 mg or less per serving. Fruits Apple, about 6 mg. Banana, about  12 mg. Vegetables Lettuce, 19 mg per 2 oz serving. Tomato, about 11 mg. Grains Rice, 4 mg per 6 oz serving. Boiled potatoes, 14 mg per 8 oz serving. White bread, 6 mg per slice. Meats and other proteins Egg, 27 mg per 2 oz serving. Red meat, 7 mg per 4 oz serving. Chicken, 17 mg per 4 oz serving. Fish, cod, or trout, 20 mg per 4 oz serving. Dairy Cream cheese, regular, 14 mg per 1 Tbsp serving. Brie cheese, 50 mg per 1 oz serving. Parmesan cheese, 70 mg per 1 Tbsp serving. The items listed above may not be a complete list of foods lower in calcium. Actual amounts of calcium may be different depending on processing. Contact a dietitian for more information. Summary Calcium is an important mineral in the body because it affects many functions. Getting enough calcium helps support strong bones and teeth throughout your life. Try to get most of your calcium from food. Calcium supplements may interact with certain medicines. Check with your health care  provider or dietitian before starting any calcium supplements. This information is not intended to replace advice given to you by your health care provider. Make sure you discuss any questions you have with your health care provider. Document Revised: 07/23/2019 Document Reviewed: 07/23/2019 Elsevier Patient Education  [place].

## 2021-06-09 ENCOUNTER — Encounter: Payer: Self-pay | Admitting: Obstetrics and Gynecology

## 2021-06-09 LAB — CYTOLOGY - PAP
Comment: NEGATIVE
Diagnosis: NEGATIVE
High risk HPV: NEGATIVE

## 2021-06-17 DIAGNOSIS — H25811 Combined forms of age-related cataract, right eye: Secondary | ICD-10-CM | POA: Diagnosis not present

## 2021-06-24 DIAGNOSIS — H25812 Combined forms of age-related cataract, left eye: Secondary | ICD-10-CM | POA: Diagnosis not present

## 2021-06-28 ENCOUNTER — Encounter: Payer: Self-pay | Admitting: Physician Assistant

## 2021-06-28 ENCOUNTER — Ambulatory Visit (INDEPENDENT_AMBULATORY_CARE_PROVIDER_SITE_OTHER): Payer: Medicare HMO | Admitting: Physician Assistant

## 2021-06-28 VITALS — BP 120/60 | HR 69 | Ht 68.5 in | Wt 188.0 lb

## 2021-06-28 DIAGNOSIS — E559 Vitamin D deficiency, unspecified: Secondary | ICD-10-CM | POA: Insufficient documentation

## 2021-06-28 DIAGNOSIS — E782 Mixed hyperlipidemia: Secondary | ICD-10-CM | POA: Diagnosis not present

## 2021-06-28 DIAGNOSIS — Z6828 Body mass index (BMI) 28.0-28.9, adult: Secondary | ICD-10-CM | POA: Diagnosis not present

## 2021-06-28 DIAGNOSIS — Z86010 Personal history of colonic polyps: Secondary | ICD-10-CM | POA: Insufficient documentation

## 2021-06-28 DIAGNOSIS — F418 Other specified anxiety disorders: Secondary | ICD-10-CM | POA: Diagnosis not present

## 2021-06-28 DIAGNOSIS — R002 Palpitations: Secondary | ICD-10-CM | POA: Diagnosis not present

## 2021-06-28 MED ORDER — BUPROPION HCL 75 MG PO TABS: 75.0000 mg | ORAL_TABLET | Freq: Two times a day (BID) | ORAL | 3 refills | Status: DC

## 2021-06-28 NOTE — Progress Notes (Signed)
? ?Acute Office Visit ? ?Subjective:  ? ? Patient ID: Robin Gilbert, female    DOB: [birth year], 66 y.o.   MRN: [id number] ? ?Chief Complaint  ?Patient presents with  ? Acute Visit  ?  Pt here for elevated heart rate. And also wanted to discuss anxiety and depression.  ? ? ?HPI ?Patient is in today for a follow up appointment to evaluate heart palpitations. 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 ?Also reports that her level of anxiety seems to be higher since retiring and requests to try something to help her anxiety that will not make her gain weight.  ? ?Outpatient Medications Prior to Visit  ?Medication Sig Dispense Refill  ? atorvastatin (LIPITOR) 10 MG tablet Take 1 tablet (10 mg total) by mouth daily. 90 tablet 3  ? B Complex Vitamins (B COMPLEX 1 PO) B Complex ? Take once a day    ? Ferrous Sulfate (IRON PO)     ? Omega-3 Fatty Acids (FISH OIL) 1000 MG CAPS Take 1 capsule by mouth at bedtime.    ? Prednisol Ace-Moxiflox-Bromfen 1-0.5-0.075 % SUSP Place 1 drop into the right eye 4 (four) times daily.    ? TURMERIC CURCUMIN PO Take 1 tablet by mouth at bedtime.    ? vitamin C (ASCORBIC ACID) 500 MG tablet Take 500 mg by mouth daily. Takes 1-2 tablets per day    ? zinc gluconate 50 MG tablet Take 50 mg by mouth daily.    ? ECHINACEA EXTRACT PO Take 1 tablet by mouth at bedtime. (Patient not taking: Reported on 06/28/2021)    ? ?No  facility-administered medications prior to visit.  ? ? ?Allergies  ?Allergen Reactions  ? Amoxicillin Other (See Comments)  ?  Pt states she had to take probiotics when she took this. Did not agree with her  ? ? ?Review of Systems  ?Constitutional:  Negative for activity change and chills.  ?HENT:  Negative for congestion and voice change.   ?Eyes:  Negative for pain and redness.  ?Respiratory:  Negative for cough and wheezing.   ?Cardiovascular:  Positive for palpitations. Negative for chest pain.  ?Gastrointestinal:  Negative for constipation, diarrhea, nausea and vomiting.  ?Endocrine: Negative for polyuria.  ?Genitourinary:  Negative for frequency.  ?Skin:  Negative for color change and rash.  ?Allergic/Immunologic: Negative for immunocompromised state.  ?Neurological:  Negative for dizziness.  ?Psychiatric/Behavioral:  Negative for agitation.   ? ?   ?Objective:  ?  ?Physical Exam ?Vitals and nursing note reviewed.  ?Constitutional:   ?   General: She is not in acute distress. ?   Appearance: Normal appearance. She is not ill-appearing.  ?HENT:  ?   Head: Normocephalic and atraumatic.  ?   Right Ear: External ear normal.  ?   Left Ear: External ear normal.  ?   Nose: No congestion.  ?Eyes:  ?   Extraocular Movements: Extraocular movements intact.  ?   Conjunctiva/sclera:  Conjunctivae normal.  ?   Pupils: Pupils are equal, round, and reactive to light.  ?Cardiovascular:  ?   Rate and Rhythm: Normal rate and regular rhythm.  ?   Pulses: Normal pulses.  ?   Heart sounds: Normal heart sounds.  ?Pulmonary:  ?   Effort: Pulmonary effort is normal.  ?   Breath sounds: Normal breath sounds. No wheezing.  ?Abdominal:  ?   General: Bowel sounds are normal.  ?   Palpations: Abdomen is soft.  ?Musculoskeletal:  ?   Cervical back: Normal range of motion and neck supple.  ?   Right lower leg: No edema.  ?   Left lower leg: No edema.  ?Skin: ?   General: Skin is warm and dry.  ?   Findings: No bruising.  ?Neurological:  ?    General: No focal deficit present.  ?   Mental Status: She is alert and oriented to person, place, and time.  ?Psychiatric:     ?   Mood and Affect: Mood normal.     ?   Behavior: Behavior normal.     ?   Thought Content: Thought content normal.  ? ? ?BP 120/60   Pulse 69   Ht 5' 8.5" (1.74 m)   Wt 188 lb (85.3 kg)   BMI 28.17 kg/m?  ? ?Wt Readings from Last 3 Encounters:  ?06/28/21 188 lb (85.3 kg)  ?06/08/21 188 lb (85.3 kg)  ?02/14/21 193 lb 12.8 oz (87.9 kg)  ? ? ?Results for orders placed or performed in visit on 06/28/21  ?CBC with Differential/Platelet  ?Result Value Ref Range  ? WBC 4.2 3.4 - 10.8 x10E3/uL  ? RBC 4.48 3.77 - 5.28 x10E6/uL  ? Hemoglobin 12.6 11.1 - 15.9 g/dL  ? Hematocrit 38.5 34.0 - 46.6 %  ? MCV 86 79 - 97 fL  ? MCH 28.1 26.6 - 33.0 pg  ? MCHC 32.7 31.5 - 35.7 g/dL  ? RDW 13.0 11.7 - 15.4 %  ? Platelets 289 150 - 450 x10E3/uL  ? Neutrophils 46 Not Estab. %  ? Lymphs 42 Not Estab. %  ? Monocytes 8 Not Estab. %  ? Eos 3 Not Estab. %  ? Basos 1 Not Estab. %  ? Neutrophils Absolute 2.0 1.4 - 7.0 x10E3/uL  ? Lymphocytes Absolute 1.8 0.7 - 3.1 x10E3/uL  ? Monocytes Absolute 0.3 0.1 - 0.9 x10E3/uL  ? EOS (ABSOLUTE) 0.1 0.0 - 0.4 x10E3/uL  ? Basophils Absolute 0.0 0.0 - 0.2 x10E3/uL  ? Immature Granulocytes 0 Not Estab. %  ? Immature Grans (Abs) 0.0 0.0 - 0.1 x10E3/uL  ?Comprehensive metabolic panel  ?Result Value Ref Range  ? Glucose 97 70 - 99 mg/dL  ? BUN 10 8 - 27 mg/dL  ? Creatinine, Ser 0.89 0.57 - 1.00 mg/dL  ? eGFR 71 >59 mL/min/1.73  ? BUN/Creatinine Ratio 11 (L) 12 - 28  ? Sodium 144 134 - 144 mmol/L  ? Potassium 4.4 3.5 - 5.2 mmol/L  ? Chloride 106 96 - 106 mmol/L  ? CO2 24 20 - 29 mmol/L  ? Calcium 10.0 8.7 - 10.3 mg/dL  ? Total Protein 7.2 6.0 - 8.5 g/dL  ? Albumin 4.7 3.8 - 4.8 g/dL  ? Globulin, Total 2.5 1.5 - 4.5 g/dL  ? Albumin/Globulin Ratio 1.9 1.2 - 2.2  ? Bilirubin Total 0.8 0.0 - 1.2 mg/dL  ? Alkaline Phosphatase 63 44 - 121 IU/L  ? AST 24 0 - 40 IU/L  ?  ALT 22 0 - 32 IU/L   ?TSH + free T4  ?Result Value Ref Range  ? TSH 1.450 0.450 - 4.500 uIU/mL  ? Free T4 1.12 0.82 - 1.77 ng/dL  ? ? ?   ?Assessment & Plan:  ?1. Palpitations ?- EKG 12-Lead ?- CBC with Differential/Platelet ?- TSH + free T4 ? ?2. Mixed hyperlipidemia ?- Comprehensive metabolic panel ? ?3. Body mass index (BMI) 28.0-28.9, adult ? ?4. Situational anxiety ?- TSH + free T4 ? ? ? ?Meds ordered this encounter  ?Medications  ? buPROPion (WELLBUTRIN) 75 MG tablet  ?  Sig: Take 1 tablet (75 mg total) by mouth 2 (two) times daily. Take 1 by mouth once a day for 7 days, then increase to every 12 hours.  ?  Dispense:  60 tablet  ?  Refill:  3  ?  Order Specific Question:   Supervising Provider  ?  Answer:   Denita Lung [[id number]]  ? ?Will wait for Cardio referral since EKG was stable without acute ischemia. ? ? ?Return in about 3 months (around 09/28/2021) for Return for Follow Up Exam. ? ?Irene Pap, PA-C ?

## 2021-06-29 LAB — COMPREHENSIVE METABOLIC PANEL
ALT: 22 IU/L (ref 0–32)
AST: 24 IU/L (ref 0–40)
Albumin/Globulin Ratio: 1.9 (ref 1.2–2.2)
Albumin: 4.7 g/dL (ref 3.8–4.8)
Alkaline Phosphatase: 63 IU/L (ref 44–121)
BUN/Creatinine Ratio: 11 — ABNORMAL LOW (ref 12–28)
BUN: 10 mg/dL (ref 8–27)
Bilirubin Total: 0.8 mg/dL (ref 0.0–1.2)
CO2: 24 mmol/L (ref 20–29)
Calcium: 10 mg/dL (ref 8.7–10.3)
Chloride: 106 mmol/L (ref 96–106)
Creatinine, Ser: 0.89 mg/dL (ref 0.57–1.00)
Globulin, Total: 2.5 g/dL (ref 1.5–4.5)
Glucose: 97 mg/dL (ref 70–99)
Potassium: 4.4 mmol/L (ref 3.5–5.2)
Sodium: 144 mmol/L (ref 134–144)
Total Protein: 7.2 g/dL (ref 6.0–8.5)
eGFR: 71 mL/min/{1.73_m2} (ref >59)

## 2021-06-29 LAB — CBC WITH DIFFERENTIAL/PLATELET
Basophils Absolute: 0 10*3/uL (ref 0.0–0.2)
Basos: 1 %
EOS (ABSOLUTE): 0.1 10*3/uL (ref 0.0–0.4)
Eos: 3 %
Hematocrit: 38.5 % (ref 34.0–46.6)
Hemoglobin: 12.6 g/dL (ref 11.1–15.9)
Immature Grans (Abs): 0 10*3/uL (ref 0.0–0.1)
Immature Granulocytes: 0 %
Lymphocytes Absolute: 1.8 10*3/uL (ref 0.7–3.1)
Lymphs: 42 %
MCH: 28.1 pg (ref 26.6–33.0)
MCHC: 32.7 g/dL (ref 31.5–35.7)
MCV: 86 fL (ref 79–97)
Monocytes Absolute: 0.3 10*3/uL (ref 0.1–0.9)
Monocytes: 8 %
Neutrophils Absolute: 2 10*3/uL (ref 1.4–7.0)
Neutrophils: 46 %
Platelets: 289 10*3/uL (ref 150–450)
RBC: 4.48 x10E6/uL (ref 3.77–5.28)
RDW: 13 % (ref 11.7–15.4)
WBC: 4.2 10*3/uL (ref 3.4–10.8)

## 2021-06-29 LAB — TSH+FREE T4
Free T4: 1.12 ng/dL (ref 0.82–1.77)
TSH: 1.45 u[IU]/mL (ref 0.450–4.500)

## 2021-06-30 DIAGNOSIS — F418 Other specified anxiety disorders: Secondary | ICD-10-CM | POA: Insufficient documentation

## 2021-06-30 NOTE — Assessment & Plan Note (Signed)
stable °

## 2021-06-30 NOTE — Assessment & Plan Note (Signed)
Stable, will start wellbutrin 75 mg daily for 7 days, then bid afterwards; call with any adverse reactions ?

## 2021-06-30 NOTE — Assessment & Plan Note (Signed)
controlled, continue lipitor, eat a low fat diet, increase fiber intake (Benefiber or Metamucil, Cherrios,  oatmeal, beans, nuts, fruits and vegetables), limit saturated fats (in fried foods, red meat), can add OTC fish oil supplement, eat fish with Omega-3 fatty acids like salmon and tuna, exercise for 30 minutes 3 - 5 times a week, drink 8 - 10 glasses of water a day ? ? ?

## 2021-08-03 DIAGNOSIS — Z0100 Encounter for examination of eyes and vision without abnormal findings: Secondary | ICD-10-CM | POA: Diagnosis not present

## 2021-08-19 ENCOUNTER — Other Ambulatory Visit: Payer: Medicare HMO

## 2021-08-25 NOTE — Progress Notes (Signed)
Acute Office Visit  Subjective:    Patient ID: Robin Gilbert, female    DOB: 1955/12/11, 66 y.o.   MRN: [id number]  Chief Complaint  Patient presents with   Acute Visit    Lipoma under right arm pit. Was sent to specialist  last year by [facility] and would like to go back if possible. Wants to ask about about eye health.    HPI Patient is in today requesting a referral back to general surgeon to get right axilla lipoma evaluated for removal; reports intermittent pain in right axilla, denies numbness or tingling; doesn't affect use of arm; is right hand dominant.  Outpatient Medications Prior to Visit  Medication Sig Dispense Refill   atorvastatin (LIPITOR) 10 MG tablet Take 1 tablet (10 mg total) by mouth daily. 90 tablet 3   B Complex Vitamins (B COMPLEX 1 PO) B Complex  Take once a day     ECHINACEA EXTRACT PO Take 1 tablet by mouth at bedtime.     Ferrous Sulfate (IRON PO)      Omega-3 Fatty Acids (FISH OIL) 1000 MG CAPS Take 1 capsule by mouth at bedtime.     Prednisol Ace-Moxiflox-Bromfen 1-0.5-0.075 % SUSP Place 1 drop into the right eye 4 (four) times daily.     TURMERIC CURCUMIN PO Take 1 tablet by mouth at bedtime.     vitamin C (ASCORBIC ACID) 500 MG tablet Take 500 mg by mouth daily. Takes 1-2 tablets per day     zinc gluconate 50 MG tablet Take 50 mg by mouth daily.     buPROPion (WELLBUTRIN) 75 MG tablet Take 1 tablet (75 mg total) by mouth 2 (two) times daily. Take 1 by mouth once a day for 7 days, then increase to every 12 hours. (Patient not taking: Reported on 08/26/2021) 60 tablet 3   No facility-administered medications prior to visit.    Allergies  Allergen Reactions   Amoxicillin Other (See Comments)    Pt states she had to take probiotics when she took this. Did not agree with her    Review of Systems  Constitutional:  Negative for activity change and chills.  HENT:  Negative for congestion and voice change.   Eyes:  Negative for pain and redness.   Respiratory:  Negative for cough and wheezing.   Cardiovascular:  Negative for chest pain.  Gastrointestinal:  Negative for constipation, diarrhea, nausea and vomiting.  Endocrine: Negative for polyuria.  Genitourinary:  Negative for frequency.  Skin:  Negative for color change and rash.  Allergic/Immunologic: Negative for immunocompromised state.  Neurological:  Negative for dizziness.  Psychiatric/Behavioral:  Negative for agitation.       Objective:    Physical Exam Vitals and nursing note reviewed.  Constitutional:      General: She is not in acute distress.    Appearance: Normal appearance. She is not ill-appearing.  HENT:     Head: Normocephalic and atraumatic.     Right Ear: External ear normal.     Left Ear: External ear normal.     Nose: No congestion.  Eyes:     Extraocular Movements: Extraocular movements intact.     Conjunctiva/sclera: Conjunctivae normal.     Pupils: Pupils are equal, round, and reactive to light.  Pulmonary:     Effort: Pulmonary effort is normal.     Breath sounds: No wheezing.  Musculoskeletal:        General: Normal range of motion.     Cervical back:  Normal range of motion and neck supple.     Right lower leg: No edema.     Left lower leg: No edema.  Skin:    General: Skin is warm and dry.     Findings: No bruising.  Neurological:     General: No focal deficit present.     Mental Status: She is alert and oriented to person, place, and time.  Psychiatric:        Mood and Affect: Mood normal.        Behavior: Behavior normal.        Thought Content: Thought content normal.    BP 120/60   Pulse 60   Ht 5' 8.5" (1.74 m)   Wt 187 lb (84.8 kg)   SpO2 100%   BMI 28.02 kg/m   Wt Readings from Last 3 Encounters:  08/26/21 187 lb (84.8 kg)  06/28/21 188 lb (85.3 kg)  06/08/21 188 lb (85.3 kg)    Results for orders placed or performed in visit on 06/28/21  CBC with Differential/Platelet  Result Value Ref Range   WBC 4.2 3.4 -  10.8 x10E3/uL   RBC 4.48 3.77 - 5.28 x10E6/uL   Hemoglobin 12.6 11.1 - 15.9 g/dL   Hematocrit 38.5 34.0 - 46.6 %   MCV 86 79 - 97 fL   MCH 28.1 26.6 - 33.0 pg   MCHC 32.7 31.5 - 35.7 g/dL   RDW 13.0 11.7 - 15.4 %   Platelets 289 150 - 450 x10E3/uL   Neutrophils 46 Not Estab. %   Lymphs 42 Not Estab. %   Monocytes 8 Not Estab. %   Eos 3 Not Estab. %   Basos 1 Not Estab. %   Neutrophils Absolute 2.0 1.4 - 7.0 x10E3/uL   Lymphocytes Absolute 1.8 0.7 - 3.1 x10E3/uL   Monocytes Absolute 0.3 0.1 - 0.9 x10E3/uL   EOS (ABSOLUTE) 0.1 0.0 - 0.4 x10E3/uL   Basophils Absolute 0.0 0.0 - 0.2 x10E3/uL   Immature Granulocytes 0 Not Estab. %   Immature Grans (Abs) 0.0 0.0 - 0.1 x10E3/uL  Comprehensive metabolic panel  Result Value Ref Range   Glucose 97 70 - 99 mg/dL   BUN 10 8 - 27 mg/dL   Creatinine, Ser 0.89 0.57 - 1.00 mg/dL   eGFR 71 >59 mL/min/1.73   BUN/Creatinine Ratio 11 (L) 12 - 28   Sodium 144 134 - 144 mmol/L   Potassium 4.4 3.5 - 5.2 mmol/L   Chloride 106 96 - 106 mmol/L   CO2 24 20 - 29 mmol/L   Calcium 10.0 8.7 - 10.3 mg/dL   Total Protein 7.2 6.0 - 8.5 g/dL   Albumin 4.7 3.8 - 4.8 g/dL   Globulin, Total 2.5 1.5 - 4.5 g/dL   Albumin/Globulin Ratio 1.9 1.2 - 2.2   Bilirubin Total 0.8 0.0 - 1.2 mg/dL   Alkaline Phosphatase 63 44 - 121 IU/L   AST 24 0 - 40 IU/L   ALT 22 0 - 32 IU/L  TSH + free T4  Result Value Ref Range   TSH 1.450 0.450 - 4.500 uIU/mL   Free T4 1.12 0.82 - 1.77 ng/dL       Assessment & Plan:  1. Lipoma of right axilla - Ambulatory referral to General Surgery  Also asks about eye health; advised that she can take an OTC ocuvite type supplement and follow up with her Eye Dr (Ophthalmologist)  No orders of the defined types were placed in this encounter.  Return in 10 months (on 06/27/2022) for Return for Annual Exam with PCP Jimmye Norman.  Irene Pap, PA-C

## 2021-08-26 ENCOUNTER — Ambulatory Visit (INDEPENDENT_AMBULATORY_CARE_PROVIDER_SITE_OTHER): Payer: Medicare HMO | Admitting: Physician Assistant

## 2021-08-26 ENCOUNTER — Encounter: Payer: Self-pay | Admitting: Physician Assistant

## 2021-08-26 VITALS — BP 120/60 | HR 60 | Ht 68.5 in | Wt 187.0 lb

## 2021-08-26 DIAGNOSIS — D1721 Benign lipomatous neoplasm of skin and subcutaneous tissue of right arm: Secondary | ICD-10-CM | POA: Diagnosis not present

## 2021-08-26 NOTE — Patient Instructions (Signed)
You will get a call to schedule an appointment with: Kennieth Francois, MD (Attending) [phone] (Work) [phone] (Fax) [address] [place],  [address] General Surgery

## 2021-10-07 ENCOUNTER — Ambulatory Visit: Payer: Medicare HMO | Admitting: Physician Assistant

## 2021-10-10 DIAGNOSIS — Z1231 Encounter for screening mammogram for malignant neoplasm of breast: Secondary | ICD-10-CM | POA: Diagnosis not present

## 2021-10-10 DIAGNOSIS — Z780 Asymptomatic menopausal state: Secondary | ICD-10-CM | POA: Diagnosis not present

## 2021-10-10 DIAGNOSIS — M8589 Other specified disorders of bone density and structure, multiple sites: Secondary | ICD-10-CM | POA: Diagnosis not present

## 2021-10-10 LAB — HM MAMMOGRAPHY

## 2021-10-10 LAB — HM DEXA SCAN

## 2021-10-13 ENCOUNTER — Encounter: Payer: Self-pay | Admitting: Physician Assistant

## 2021-10-13 DIAGNOSIS — M8580 Other specified disorders of bone density and structure, unspecified site: Secondary | ICD-10-CM | POA: Insufficient documentation

## 2021-10-14 ENCOUNTER — Encounter: Payer: Self-pay | Admitting: Physician Assistant

## 2021-11-21 DIAGNOSIS — M9904 Segmental and somatic dysfunction of sacral region: Secondary | ICD-10-CM | POA: Diagnosis not present

## 2021-11-21 DIAGNOSIS — M9903 Segmental and somatic dysfunction of lumbar region: Secondary | ICD-10-CM | POA: Diagnosis not present

## 2021-11-21 DIAGNOSIS — M9902 Segmental and somatic dysfunction of thoracic region: Secondary | ICD-10-CM | POA: Diagnosis not present

## 2021-11-21 DIAGNOSIS — M546 Pain in thoracic spine: Secondary | ICD-10-CM | POA: Diagnosis not present

## 2021-11-21 DIAGNOSIS — M9901 Segmental and somatic dysfunction of cervical region: Secondary | ICD-10-CM | POA: Diagnosis not present

## 2021-11-21 DIAGNOSIS — M5136 Other intervertebral disc degeneration, lumbar region: Secondary | ICD-10-CM | POA: Diagnosis not present

## 2021-11-25 ENCOUNTER — Ambulatory Visit (INDEPENDENT_AMBULATORY_CARE_PROVIDER_SITE_OTHER): Payer: Medicare HMO | Admitting: Medical

## 2021-11-25 VITALS — BP 110/74 | HR 71 | Wt 186.4 lb

## 2021-11-25 DIAGNOSIS — E559 Vitamin D deficiency, unspecified: Secondary | ICD-10-CM | POA: Diagnosis not present

## 2021-11-25 DIAGNOSIS — D1721 Benign lipomatous neoplasm of skin and subcutaneous tissue of right arm: Secondary | ICD-10-CM | POA: Diagnosis not present

## 2021-11-25 DIAGNOSIS — M8580 Other specified disorders of bone density and structure, unspecified site: Secondary | ICD-10-CM | POA: Diagnosis not present

## 2021-11-25 NOTE — Patient Instructions (Signed)
I recommend exercise regularly including aerobic and weight bearing exercise.   Weight-bearing physical activity and exercises that improve balance and posture can strengthen bones and reduce the chance of a fracture. The more active and fit you are as you age, the less likely you are to fall and break a bone.   Good nutrition. Eat a healthy diet and make certain that you're getting '1200mg'$  of calcium daily in the diet from dairy (typically 4 servings) or supplements OTC.   Also they should be getting Vitamin D through eating fish, seafood, getting sun exposure, and using either OTC Vitamin D supplement such as 600 IU daily   Lets plan to repeat the bone density study in 2-4 years

## 2021-11-25 NOTE — Progress Notes (Signed)
Subjective: Chief Complaint  Patient presents with   discuss bone density results    Discuss bone density results. Would also like vitamin d checked   Here for discussion of recent bone density test.   Was on liquid vitamin D in the past.  At one point was told to stop taking this as she was too high at one point and was getting dizzy.  Wants to get updated lab on vitamin D.  She notes that she is just starting to do weight bearing exercise.  Currently exercise is recumbent bike and goes to adult fitness center as well, just started at fitness center in last 2 months.  Is curious if she needs to be on hormones.  Has hx/o lipoma in armpit.  Not sure if she needs to do anything with this.    Saw surgery last year who advised against excision   Past Medical History:  Diagnosis Date   Anxiety    Depression    Fibroid    High cholesterol    Pure hypercholesterolemia 01/13/2021   Substance abuse ([place])    recovering alcohol--sober for 27 years   Thyroid disease    Current Outpatient Medications on File Prior to Visit  Medication Sig Dispense Refill   atorvastatin (LIPITOR) 10 MG tablet Take 1 tablet (10 mg total) by mouth daily. 90 tablet 3   B Complex Vitamins (B COMPLEX 1 PO) B Complex  Take once a day     ECHINACEA EXTRACT PO Take 1 tablet by mouth at bedtime.     Prednisol Ace-Moxiflox-Bromfen 1-0.5-0.075 % SUSP Place 1 drop into the right eye 4 (four) times daily.     TURMERIC CURCUMIN PO Take 1 tablet by mouth at bedtime.     vitamin C (ASCORBIC ACID) 500 MG tablet Take 500 mg by mouth daily. Takes 1-2 tablets per day     zinc gluconate 50 MG tablet Take 50 mg by mouth daily.     Ferrous Sulfate (IRON PO)  (Patient not taking: Reported on 11/25/2021)     No current facility-administered medications on file prior to visit.    ROS as in subjective   Objective: BP 110/74   Pulse 71   Wt 186 lb 6.4 oz (84.6 kg)   BMI 27.93 kg/m   Gen: wd, wn, nad Right axilla somewhat  anterior with fullness suggestive of lipoma, no other worrisome lesions palpated   Assessment: Encounter Diagnoses  Name Primary?   Osteopenia, unspecified location Yes   Vitamin D deficiency    Lipoma of right axilla     Plan Osteopenia-we discussed the July 2023 bone density study that shows osteopenia on the mild side.  We discussed the importance of weightbearing and aerobic exercise, discussed importance of calcium and vitamin D.  She does not get a lot of calcium in the diet, so we recommended a supplement.  We will check a vitamin D level today.  She has been on supplement in the past answered her questions.  We will plan repeat bone density test in 2 to 4 years.  Vitamin D deficiency-updated labs today  Lipoma-reassured.  We will request prior general surgery consult notes.   Robin Gilbert was seen today for discuss bone density results.  Diagnoses and all orders for this visit:  Osteopenia, unspecified location  Vitamin D deficiency -     VITAMIN D 25 Hydroxy (Vit-D Deficiency, Fractures)  Lipoma of right axilla   F/u yearly for well visit

## 2021-11-26 LAB — VITAMIN D 25 HYDROXY (VIT D DEFICIENCY, FRACTURES): Vit D, 25-Hydroxy: 41 ng/mL (ref 30.0–100.0)

## 2021-12-05 DIAGNOSIS — M546 Pain in thoracic spine: Secondary | ICD-10-CM | POA: Diagnosis not present

## 2021-12-05 DIAGNOSIS — M9901 Segmental and somatic dysfunction of cervical region: Secondary | ICD-10-CM | POA: Diagnosis not present

## 2021-12-05 DIAGNOSIS — M9903 Segmental and somatic dysfunction of lumbar region: Secondary | ICD-10-CM | POA: Diagnosis not present

## 2021-12-05 DIAGNOSIS — M5136 Other intervertebral disc degeneration, lumbar region: Secondary | ICD-10-CM | POA: Diagnosis not present

## 2021-12-05 DIAGNOSIS — M9904 Segmental and somatic dysfunction of sacral region: Secondary | ICD-10-CM | POA: Diagnosis not present

## 2021-12-05 DIAGNOSIS — M9902 Segmental and somatic dysfunction of thoracic region: Secondary | ICD-10-CM | POA: Diagnosis not present

## 2021-12-14 ENCOUNTER — Encounter: Payer: Self-pay | Admitting: Internal Medicine

## 2021-12-19 DIAGNOSIS — M9901 Segmental and somatic dysfunction of cervical region: Secondary | ICD-10-CM | POA: Diagnosis not present

## 2021-12-19 DIAGNOSIS — M9904 Segmental and somatic dysfunction of sacral region: Secondary | ICD-10-CM | POA: Diagnosis not present

## 2021-12-19 DIAGNOSIS — M5136 Other intervertebral disc degeneration, lumbar region: Secondary | ICD-10-CM | POA: Diagnosis not present

## 2021-12-19 DIAGNOSIS — M9902 Segmental and somatic dysfunction of thoracic region: Secondary | ICD-10-CM | POA: Diagnosis not present

## 2021-12-19 DIAGNOSIS — M546 Pain in thoracic spine: Secondary | ICD-10-CM | POA: Diagnosis not present

## 2021-12-19 DIAGNOSIS — M9903 Segmental and somatic dysfunction of lumbar region: Secondary | ICD-10-CM | POA: Diagnosis not present

## 2021-12-23 DIAGNOSIS — M5136 Other intervertebral disc degeneration, lumbar region: Secondary | ICD-10-CM | POA: Diagnosis not present

## 2021-12-23 DIAGNOSIS — M9902 Segmental and somatic dysfunction of thoracic region: Secondary | ICD-10-CM | POA: Diagnosis not present

## 2021-12-23 DIAGNOSIS — M9903 Segmental and somatic dysfunction of lumbar region: Secondary | ICD-10-CM | POA: Diagnosis not present

## 2021-12-23 DIAGNOSIS — M546 Pain in thoracic spine: Secondary | ICD-10-CM | POA: Diagnosis not present

## 2021-12-23 DIAGNOSIS — M9901 Segmental and somatic dysfunction of cervical region: Secondary | ICD-10-CM | POA: Diagnosis not present

## 2021-12-23 DIAGNOSIS — M9904 Segmental and somatic dysfunction of sacral region: Secondary | ICD-10-CM | POA: Diagnosis not present

## 2021-12-28 ENCOUNTER — Telehealth: Payer: Self-pay | Admitting: Physician Assistant

## 2021-12-28 DIAGNOSIS — M9902 Segmental and somatic dysfunction of thoracic region: Secondary | ICD-10-CM | POA: Diagnosis not present

## 2021-12-28 DIAGNOSIS — M9901 Segmental and somatic dysfunction of cervical region: Secondary | ICD-10-CM | POA: Diagnosis not present

## 2021-12-28 DIAGNOSIS — M9903 Segmental and somatic dysfunction of lumbar region: Secondary | ICD-10-CM | POA: Diagnosis not present

## 2021-12-28 DIAGNOSIS — M9904 Segmental and somatic dysfunction of sacral region: Secondary | ICD-10-CM | POA: Diagnosis not present

## 2021-12-28 NOTE — Telephone Encounter (Signed)
Left message for patient to call back and schedule Medicare Annual Wellness Visit (AWV) either virtually or in office. I left my number for patient to call [phone].  Last WTM 12/31/20  please schedule at anytime with health coach

## 2021-12-30 DIAGNOSIS — M9902 Segmental and somatic dysfunction of thoracic region: Secondary | ICD-10-CM | POA: Diagnosis not present

## 2021-12-30 DIAGNOSIS — M9903 Segmental and somatic dysfunction of lumbar region: Secondary | ICD-10-CM | POA: Diagnosis not present

## 2021-12-30 DIAGNOSIS — M9904 Segmental and somatic dysfunction of sacral region: Secondary | ICD-10-CM | POA: Diagnosis not present

## 2021-12-30 DIAGNOSIS — M9901 Segmental and somatic dysfunction of cervical region: Secondary | ICD-10-CM | POA: Diagnosis not present

## 2022-01-06 DIAGNOSIS — M9904 Segmental and somatic dysfunction of sacral region: Secondary | ICD-10-CM | POA: Diagnosis not present

## 2022-01-06 DIAGNOSIS — M9902 Segmental and somatic dysfunction of thoracic region: Secondary | ICD-10-CM | POA: Diagnosis not present

## 2022-01-06 DIAGNOSIS — M9901 Segmental and somatic dysfunction of cervical region: Secondary | ICD-10-CM | POA: Diagnosis not present

## 2022-01-06 DIAGNOSIS — M9903 Segmental and somatic dysfunction of lumbar region: Secondary | ICD-10-CM | POA: Diagnosis not present

## 2022-01-11 DIAGNOSIS — M9902 Segmental and somatic dysfunction of thoracic region: Secondary | ICD-10-CM | POA: Diagnosis not present

## 2022-01-11 DIAGNOSIS — M9903 Segmental and somatic dysfunction of lumbar region: Secondary | ICD-10-CM | POA: Diagnosis not present

## 2022-01-13 ENCOUNTER — Ambulatory Visit (INDEPENDENT_AMBULATORY_CARE_PROVIDER_SITE_OTHER): Payer: Medicare HMO

## 2022-01-13 VITALS — BP 110/70 | HR 75 | Temp 98.1°F | Ht 71.0 in | Wt 183.2 lb

## 2022-01-13 DIAGNOSIS — Z0000 Encounter for general adult medical examination without abnormal findings: Secondary | ICD-10-CM | POA: Diagnosis not present

## 2022-01-13 NOTE — Progress Notes (Signed)
Subjective:   Robin Gilbert is a 66 y.o. female who presents for an Initial Medicare Annual Wellness Visit.  Review of Systems     Cardiac Risk Factors include: advanced age (>4mn, >>64women);dyslipidemia     Objective:    Today's Vitals   01/13/22 0922  BP: 110/70  Pulse: 75  Temp: 98.1 F (36.7 C)  TempSrc: Oral  SpO2: 98%  Weight: 183 lb 3.2 oz (83.1 kg)  Height: '5\' 11"'$  (1.803 m)   Body mass index is 25.55 kg/m.     01/13/2022    9:32 AM  Advanced Directives  Does Patient Have a Medical Advance Directive? Yes  Type of Advance Directive Out of facility DNR (pink MOST or yellow form)    Current Medications (verified) Outpatient Encounter Medications as of 01/13/2022  Medication Sig   atorvastatin (LIPITOR) 10 MG tablet Take 1 tablet (10 mg total) by mouth daily.   B Complex Vitamins (B COMPLEX 1 PO) B Complex  Take once a day   TURMERIC CURCUMIN PO Take 1 tablet by mouth at bedtime.   vitamin C (ASCORBIC ACID) 500 MG tablet Take 500 mg by mouth daily. Takes 1-2 tablets per day   ECHINACEA EXTRACT PO Take 1 tablet by mouth at bedtime. (Patient not taking: Reported on 01/13/2022)   Ferrous Sulfate (IRON PO)  (Patient not taking: Reported on 11/25/2021)   zinc gluconate 50 MG tablet Take 50 mg by mouth daily. (Patient not taking: Reported on 01/13/2022)   [DISCONTINUED] Prednisol Ace-Moxiflox-Bromfen 1-0.5-0.075 % SUSP Place 1 drop into the right eye 4 (four) times daily.   No facility-administered encounter medications on file as of 01/13/2022.    Allergies (verified) Amoxicillin   History: Past Medical History:  Diagnosis Date   Anxiety    Depression    Fibroid    High cholesterol    Pure hypercholesterolemia 01/13/2021   [place]    recovering alcohol--sober for 27 years   Thyroid disease    Past Surgical History:  Procedure Laterality Date   bartholins cyst removed  04/10/1998   CATARACT EXTRACTION Bilateral    06/2021   cyst removed      Vagina   UTERINE FIBROID SURGERY     Family History  Problem Relation Age of Onset   Ataxia Neg Hx    Social History   Socioeconomic History   Marital status: Divorced    Spouse name: Not on file   Number of children: 0   Years of education: Master's   Highest education level: Not on file  Occupational History   Occupation: [employer]  Tobacco Use   Smoking status: Former    Types: Cigarettes    Quit date: 05/14/1995    Years since quitting: 26.6   Smokeless tobacco: Never  Vaping Use   Vaping Use: Never used  Substance and Sexual Activity   Alcohol use: No   Drug use: No   Sexual activity: Not Currently    Birth control/protection: Post-menopausal  Other Topics Concern   Not on file  Social History Narrative   Lives alone   Caffeine use: Drinks coffee/tea/soda ocass   Social Determinants of Health   Financial Resource Strain: Low Risk  (01/13/2022)   Overall Financial Resource Strain (CARDIA)    Difficulty of Paying Living Expenses: Not hard at all  Food Insecurity: No Food Insecurity (01/13/2022)   Hunger Vital Sign    Worried About Running Out of Food in the Last  Year: Never true    [place] in the Last Year: Never true  Transportation Needs: No Transportation Needs (01/13/2022)   PRAPARE - Hydrologist (Medical): No    Lack of Transportation (Non-Medical): No  Physical Activity: Insufficiently Active (01/13/2022)   Exercise Vital Sign    Days of Exercise per Week: 2 days    Minutes of Exercise per Session: 60 min  Stress: No Stress Concern Present (01/13/2022)   [place]    Feeling of Stress : Not at all  Social Connections: Not on file    Tobacco Counseling Counseling given: Not Answered   Clinical Intake:  Pre-visit preparation completed: Yes  Pain : No/denies pain     Nutritional Status: BMI 25 -29  Overweight Nutritional Risks: None Diabetes: No  How often do you need to have someone help you when you read instructions, pamphlets, or other written materials from your doctor or pharmacy?: 1 - Never What is the last grade level you completed in school?: masters degree  Diabetic? no  Interpreter Needed?: No  Information entered by :: NAllen LPN   Activities of Daily Living    01/13/2022    9:34 AM  In your present state of health, do you have any difficulty performing the following activities:  Hearing? 0  Vision? 0  Difficulty concentrating or making decisions? 0  Walking or climbing stairs? 0  Dressing or bathing? 0  Doing errands, shopping? 0  Preparing Food and eating ? N  Using the Toilet? N  In the past six months, have you accidently leaked urine? N  Do you have problems with loss of bowel control? N  Managing your Medications? N  Managing your Finances? N  Housekeeping or managing your Housekeeping? N    Patient Care Team: Marcellina Millin as PCP - General (Physician Assistant)  Indicate any recent Medical Services you may have received from other than [facility] providers in the past year (date may be approximate).     Assessment:   This is a routine wellness examination for [place].  Hearing/Vision screen Vision Screening - Comments:: Regular eye exams, [facility]  Dietary issues and exercise activities discussed: Current Exercise Habits: Home exercise routine, Type of exercise: strength training/weights, Time (Minutes): 60, Frequency (Times/Week): 2, Weekly Exercise (Minutes/Week): 120   Goals Addressed             This Visit's Progress    Patient Stated       01/13/2022, wants to work on eating       Depression Screen    01/13/2022    9:33 AM 06/28/2021    3:01 PM 12/31/2020   10:58 AM 09/23/2020    1:47 PM  PHQ 2/9 Scores  PHQ - 2 Score 0 '2 1 3  '$ PHQ- 9 Score '5 2  5    '$ Fall Risk    01/13/2022    9:33 AM 06/28/2021    3:00 PM  02/14/2021    3:03 PM 12/31/2020   10:58 AM 09/23/2020    1:47 PM  Fall Risk   Falls in the past year? 0 0 0 0 0  Number falls in past yr: 0 0 0 0 0  Injury with Fall? 0 0 0 0 0  Risk for fall due to : No Fall Risks  No Fall Risks No Fall Risks No Fall Risks  Follow up Falls prevention discussed Falls  evaluation completed Falls evaluation completed Falls evaluation completed Falls evaluation completed    FALL RISK PREVENTION PERTAINING TO THE HOME:  Any stairs in or around the home? No  If so, are there any without handrails? N/a Home free of loose throw rugs in walkways, pet beds, electrical cords, etc? Yes  Adequate lighting in your home to reduce risk of falls? Yes   ASSISTIVE DEVICES UTILIZED TO PREVENT FALLS:  Life alert? No  Use of a cane, walker or w/c? No  Grab bars in the bathroom? No  Shower chair or bench in shower? No  Elevated toilet seat or a handicapped toilet? No   TIMED UP AND GO:  Was the test performed? Yes .  Length of time to ambulate 10 feet: 5 sec.   Gait steady and fast without use of assistive device  Cognitive Function:        01/13/2022    9:35 AM  6CIT Screen  What Year? 0 points  What month? 0 points  What time? 0 points  Count back from 20 0 points  Months in reverse 0 points  Repeat phrase 0 points  Total Score 0 points    Immunizations Immunization History  Administered Date(s) Administered   Fluad Quad(high Dose 65+) 02/14/2021   Moderna SARS-COV2 Booster Vaccination 02/26/2020   Pfizer Covid-19 Vaccine Bivalent Booster 36yr & up 12/31/2020   Unspecified SARS-COV-2 Vaccination 04/23/2019, 05/16/2019   Zoster Recombinat (Shingrix) 11/03/2017, 02/13/2018    TDAP status: Due, Education has been provided regarding the importance of this vaccine. Advised may receive this vaccine at local pharmacy or Health Dept. Aware to provide a copy of the vaccination record if obtained from local pharmacy or Health Dept. Verbalized acceptance  and understanding.  Flu Vaccine status: Declined, Education has been provided regarding the importance of this vaccine but patient still declined. Advised may receive this vaccine at local pharmacy or Health Dept. Aware to provide a copy of the vaccination record if obtained from local pharmacy or Health Dept. Verbalized acceptance and understanding.  Pneumococcal vaccine status: Declined,  Education has been provided regarding the importance of this vaccine but patient still declined. Advised may receive this vaccine at local pharmacy or Health Dept. Aware to provide a copy of the vaccination record if obtained from local pharmacy or Health Dept. Verbalized acceptance and understanding.   Covid-19 vaccine status: Completed vaccines  Qualifies for Shingles Vaccine? Yes   Zostavax completed Yes   Shingrix Completed?: Yes  Screening Tests Health Maintenance  Topic Date Due   COLONOSCOPY (Pts 45-446yrInsurance coverage will need to be confirmed)  Never done   COVID-19 Vaccine (4 - Mixed Product series) 05/02/2021   Pneumonia Vaccine 6525Years old (1 - PCV) 02/27/2022 (Originally 03/23/2020)   TETANUS/TDAP  02/27/2022 (Originally 03/23/1975)   INFLUENZA VACCINE  07/09/2022 (Originally 11/08/2021)   MAMMOGRAM  10/11/2023   DEXA SCAN  Completed   Hepatitis C Screening  Completed   Zoster Vaccines- Shingrix  Completed   HPV VACCINES  Aged Out    Health Maintenance  Health Maintenance Due  Topic Date Due   COLONOSCOPY (Pts [id number] coverage will need to be confirmed)  Never done   COVID-19 Vaccine (4 - Mixed Product series) 05/02/2021    Colorectal cancer screening: requesting report  Mammogram status: Completed 10/10/2021. Repeat every year  Bone Density status: Completed 10/10/2021.   Lung Cancer Screening: (Low Dose CT Chest recommended if Age 19-24-80ars, 30 pack-year currently smoking OR have quit  w/in 15years.) does not qualify.   Lung Cancer Screening Referral:  no  Additional Screening:  Hepatitis C Screening: does qualify; Completed 01/07/2021  Vision Screening: Recommended annual ophthalmology exams for early detection of glaucoma and other disorders of the eye. Is the patient up to date with their annual eye exam?  Yes  Who is the provider or what is the name of the office in which the patient attends annual eye exams? [facility] If pt is not established with a provider, would they like to be referred to a provider to establish care? No .   Dental Screening: Recommended annual dental exams for proper oral hygiene  Community Resource Referral / Chronic Care Management: CRR required this visit?  No   CCM required this visit?  No      Plan:     I have personally reviewed and noted the following in the patient's chart:   Medical and social history Use of alcohol, tobacco or illicit drugs  Current medications and supplements including opioid prescriptions. Patient is not currently taking opioid prescriptions. Functional ability and status Nutritional status Physical activity Advanced directives List of other physicians Hospitalizations, surgeries, and ER visits in previous 12 months Vitals Screenings to include cognitive, depression, and falls Referrals and appointments  In addition, I have reviewed and discussed with patient certain preventive protocols, quality metrics, and best practice recommendations. A written personalized care plan for preventive services as well as general preventive health recommendations were provided to patient.     Kellie Simmering, LPN   [id number]   Nurse Notes: none

## 2022-01-13 NOTE — Patient Instructions (Signed)
Robin Gilbert , Thank you for taking time to come for your Medicare Wellness Visit. I appreciate your ongoing commitment to your health goals. Please review the following plan we discussed and let me know if I can assist you in the future.   Screening recommendations/referrals: Colonoscopy: requesting report Mammogram: completed 10/10/2021, due 10/12/2022 Bone Density: completed 10/10/2021 Recommended yearly ophthalmology/optometry visit for glaucoma screening and checkup Recommended yearly dental visit for hygiene and checkup  Vaccinations: Influenza vaccine: decline Pneumococcal vaccine: decline Tdap vaccine: checking with [facility] Shingles vaccine: completed   Covid-19: 12/31/2020, 02/26/2020, 05/16/2019, 04/23/2019  Advanced directives: copy in chart  Conditions/risks identified: none  Next appointment: Follow up in one year for your annual wellness visit    Preventive Care 47 Years and Older, Female Preventive care refers to lifestyle choices and visits with your health care provider that can promote health and wellness. What does preventive care include? A yearly physical exam. This is also called an annual well check. Dental exams once or twice a year. Routine eye exams. Ask your health care provider how often you should have your eyes checked. Personal lifestyle choices, including: Daily care of your teeth and gums. Regular physical activity. Eating a healthy diet. Avoiding tobacco and drug use. Limiting alcohol use. Practicing safe sex. Taking low-dose aspirin every day. Taking vitamin and mineral supplements as recommended by your health care provider. What happens during an annual well check? The services and screenings done by your health care provider during your annual well check will depend on your age, overall health, lifestyle risk factors, and family history of disease. Counseling  Your health care provider may ask you questions about your: Alcohol use. Tobacco use. Drug  use. Emotional well-being. Home and relationship well-being. Sexual activity. Eating habits. History of falls. Memory and ability to understand (cognition). Work and work Statistician. Reproductive health. Screening  You may have the following tests or measurements: Height, weight, and BMI. Blood pressure. Lipid and cholesterol levels. These may be checked every 5 years, or more frequently if you are over 60 years old. Skin check. Lung cancer screening. You may have this screening every year starting at age 67 if you have a 30-pack-year history of smoking and currently smoke or have quit within the past 15 years. Fecal occult blood test (FOBT) of the stool. You may have this test every year starting at age 67. Flexible sigmoidoscopy or colonoscopy. You may have a sigmoidoscopy every 5 years or a colonoscopy every 10 years starting at age 67. Hepatitis C blood test. Hepatitis B blood test. Sexually transmitted disease (STD) testing. Diabetes screening. This is done by checking your blood sugar (glucose) after you have not eaten for a while (fasting). You may have this done every 1-3 years. Bone density scan. This is done to screen for osteoporosis. You may have this done starting at age 67. Mammogram. This may be done every 1-2 years. Talk to your health care provider about how often you should have regular mammograms. Talk with your health care provider about your test results, treatment options, and if necessary, the need for more tests. Vaccines  Your health care provider may recommend certain vaccines, such as: Influenza vaccine. This is recommended every year. Tetanus, diphtheria, and acellular pertussis (Tdap, Td) vaccine. You may need a Td booster every 10 years. Zoster vaccine. You may need this after age 67. Pneumococcal 13-valent conjugate (PCV13) vaccine. One dose is recommended after age 67. Pneumococcal polysaccharide (PPSV23) vaccine. One dose is recommended after age  65. Talk to your health care provider about which screenings and vaccines you need and how often you need them. This information is not intended to replace advice given to you by your health care provider. Make sure you discuss any questions you have with your health care provider. Document Released: 04/23/2015 Document Revised: 12/15/2015 Document Reviewed: 01/26/2015 Elsevier Interactive Patient Education  2017 [place] Prevention in the Home Falls can cause injuries. They can happen to people of all ages. There are many things you can do to make your home safe and to help prevent falls. What can I do on the outside of my home? Regularly fix the edges of walkways and driveways and fix any cracks. Remove anything that might make you trip as you walk through a door, such as a raised step or threshold. Trim any bushes or trees on the path to your home. Use bright outdoor lighting. Clear any walking paths of anything that might make someone trip, such as rocks or tools. Regularly check to see if handrails are loose or broken. Make sure that both sides of any steps have handrails. Any raised decks and porches should have guardrails on the edges. Have any leaves, snow, or ice cleared regularly. Use sand or salt on walking paths during winter. Clean up any spills in your garage right away. This includes oil or grease spills. What can I do in the bathroom? Use night lights. Install grab bars by the toilet and in the tub and shower. Do not use towel bars as grab bars. Use non-skid mats or decals in the tub or shower. If you need to sit down in the shower, use a plastic, non-slip stool. Keep the floor dry. Clean up any water that spills on the floor as soon as it happens. Remove soap buildup in the tub or shower regularly. Attach bath mats securely with double-sided non-slip rug tape. Do not have throw rugs and other things on the floor that can make you trip. What can I do in the  bedroom? Use night lights. Make sure that you have a light by your bed that is easy to reach. Do not use any sheets or blankets that are too big for your bed. They should not hang down onto the floor. Have a firm chair that has side arms. You can use this for support while you get dressed. Do not have throw rugs and other things on the floor that can make you trip. What can I do in the kitchen? Clean up any spills right away. Avoid walking on wet floors. Keep items that you use a lot in easy-to-reach places. If you need to reach something above you, use a strong step stool that has a grab bar. Keep electrical cords out of the way. Do not use floor polish or wax that makes floors slippery. If you must use wax, use non-skid floor wax. Do not have throw rugs and other things on the floor that can make you trip. What can I do with my stairs? Do not leave any items on the stairs. Make sure that there are handrails on both sides of the stairs and use them. Fix handrails that are broken or loose. Make sure that handrails are as long as the stairways. Check any carpeting to make sure that it is firmly attached to the stairs. Fix any carpet that is loose or worn. Avoid having throw rugs at the top or bottom of the stairs. If you do have throw  rugs, attach them to the floor with carpet tape. Make sure that you have a light switch at the top of the stairs and the bottom of the stairs. If you do not have them, ask someone to add them for you. What else can I do to help prevent falls? Wear shoes that: Do not have high heels. Have rubber bottoms. Are comfortable and fit you well. Are closed at the toe. Do not wear sandals. If you use a stepladder: Make sure that it is fully opened. Do not climb a closed stepladder. Make sure that both sides of the stepladder are locked into place. Ask someone to hold it for you, if possible. Clearly mark and make sure that you can see: Any grab bars or  handrails. First and last steps. Where the edge of each step is. Use tools that help you move around (mobility aids) if they are needed. These include: Canes. Walkers. Scooters. Crutches. Turn on the lights when you go into a dark area. Replace any light bulbs as soon as they burn out. Set up your furniture so you have a clear path. Avoid moving your furniture around. If any of your floors are uneven, fix them. If there are any pets around you, be aware of where they are. Review your medicines with your doctor. Some medicines can make you feel dizzy. This can increase your chance of falling. Ask your doctor what other things that you can do to help prevent falls. This information is not intended to replace advice given to you by your health care provider. Make sure you discuss any questions you have with your health care provider. Document Released: 01/21/2009 Document Revised: 09/02/2015 Document Reviewed: 05/01/2014 Elsevier Interactive Patient Education  2017 [employer].

## 2022-01-17 ENCOUNTER — Encounter: Payer: Self-pay | Admitting: Internal Medicine

## 2022-01-20 ENCOUNTER — Ambulatory Visit (INDEPENDENT_AMBULATORY_CARE_PROVIDER_SITE_OTHER): Payer: Medicare HMO | Admitting: Medical

## 2022-01-20 ENCOUNTER — Encounter: Payer: Self-pay | Admitting: Medical

## 2022-01-20 ENCOUNTER — Other Ambulatory Visit: Payer: Self-pay

## 2022-01-20 VITALS — BP 122/80 | HR 62 | Temp 98.4°F | Wt 186.6 lb

## 2022-01-20 DIAGNOSIS — M9903 Segmental and somatic dysfunction of lumbar region: Secondary | ICD-10-CM | POA: Diagnosis not present

## 2022-01-20 DIAGNOSIS — M542 Cervicalgia: Secondary | ICD-10-CM

## 2022-01-20 DIAGNOSIS — R251 Tremor, unspecified: Secondary | ICD-10-CM | POA: Diagnosis not present

## 2022-01-20 DIAGNOSIS — M9904 Segmental and somatic dysfunction of sacral region: Secondary | ICD-10-CM | POA: Diagnosis not present

## 2022-01-20 DIAGNOSIS — M9901 Segmental and somatic dysfunction of cervical region: Secondary | ICD-10-CM | POA: Diagnosis not present

## 2022-01-20 DIAGNOSIS — Z713 Dietary counseling and surveillance: Secondary | ICD-10-CM | POA: Diagnosis not present

## 2022-01-20 DIAGNOSIS — M9902 Segmental and somatic dysfunction of thoracic region: Secondary | ICD-10-CM | POA: Diagnosis not present

## 2022-01-20 NOTE — Progress Notes (Signed)
Subjective:  Robin Gilbert is a 66 y.o. female who presents for Chief Complaint  Patient presents with   other    Ref to nutritionists then also has a bone lower neck has been hurting hurt for a month and has been going to chiro for that and it helps but then comes back may need to see ortho for it     Wants to see nutritionist for advice on healthier diet.  Has questions about sugars, nutritions, healthy options.   Lately has had trouble sleeping, so will sometimes eat something sweet in the middle of the night.  Having pain in neck.  Sees a chiropractor, but seems to have a bone protruding at base of neck.   Curious about that.  Thinks she needs xray.  Pain interferes with sleep.  Normally can lie on the chair with hairdresser fine, but lately can feel the bone.  Has questions about hand shakiness or subtle tremor at times.  No other concerns.    No other aggravating or relieving factors.    No other c/o.  Past Medical History:  Diagnosis Date   Anxiety    Depression    Fibroid    High cholesterol    Pure hypercholesterolemia 01/13/2021   Substance abuse ([place])    recovering alcohol--sober for 27 years   Thyroid disease    Current Outpatient Medications on File Prior to Visit  Medication Sig Dispense Refill   atorvastatin (LIPITOR) 10 MG tablet Take 1 tablet (10 mg total) by mouth daily. 90 tablet 3   B Complex Vitamins (B COMPLEX 1 PO) B Complex  Take once a day     vitamin C (ASCORBIC ACID) 500 MG tablet Take 500 mg by mouth daily. Takes 1-2 tablets per day     ECHINACEA EXTRACT PO Take 1 tablet by mouth at bedtime. (Patient not taking: Reported on 01/13/2022)     Ferrous Sulfate (IRON PO)  (Patient not taking: Reported on 11/25/2021)     TURMERIC CURCUMIN PO Take 1 tablet by mouth at bedtime. (Patient not taking: Reported on 01/20/2022)     zinc gluconate 50 MG tablet Take 50 mg by mouth daily. (Patient not taking: Reported on 01/13/2022)     No current facility-administered  medications on file prior to visit.     The following portions of the patient's history were reviewed and updated as appropriate: allergies, current medications, past family history, past medical history, past social history, past surgical history and problem list.  ROS Otherwise as in subjective above  Objective: BP 122/80   Pulse 62   Temp 98.4 F (36.9 C)   Wt 186 lb 9.6 oz (84.6 kg)   BMI 26.03 kg/m   General appearance: alert, no distress, well developed, well nourished Somewhat tender over the C7 vertebrae prominence otherwise neck nontender, upper back nontender, neck normal range of motion Neck: supple, no lymphadenopathy, no thyromegaly, no masses Neuro: CN II through XII intact, nonfocal exam, normal arm strength and sensation and reflexes    Assessment: Encounter Diagnoses  Name Primary?   Neck pain Yes   Nutritional counseling    Shaky      Plan: Neck pain posterior C7 prominence area-we discussed her symptoms and concerns.  We will send for baseline x-ray.  Advised massage therapy, stretching.  This likely would improve with massage therapy and regular stretching  Referral to nutritionist for counseling on healthy diet, low sugar diet.  We discussed other strategies regarding diet  She had  other general questions regarding a subtle shake.  No specific other symptoms.  She declines any evaluation or other testing today.  It was a vague concern that she says is not very significant.  I reassured her with the question she asked   Krystol was seen today for other.  Diagnoses and all orders for this visit:  Neck pain -     DG Cervical Spine Complete; Future  Nutritional counseling  Shaky   Follow up: pending xray

## 2022-01-20 NOTE — Patient Instructions (Addendum)
Look into Whole 30 book or books about plant based eating or mediterranean diet.  We will make referral to nutrition counseling.     Please go to [facility] for your neck xray.   Their hours are 8am - 4:30 pm Monday - Friday.  Take your insurance card with you.  [facility] [phone]  [address], [place], [place] [address]  [address], [place] [address]     Consider massage therapy and trying out some other pillow types.   Massage therapy:  Kneaded by Bevelyn Ngo [phone]   [email or web address]    Ernestene Mention [facility] [address] [place] [address] [place], [place] [address] [phone] [email or web address]

## 2022-01-24 ENCOUNTER — Encounter: Payer: Self-pay | Admitting: Physician Assistant

## 2022-02-02 ENCOUNTER — Other Ambulatory Visit: Payer: Self-pay | Admitting: Family Medicine

## 2022-02-02 DIAGNOSIS — Z9189 Other specified personal risk factors, not elsewhere classified: Secondary | ICD-10-CM

## 2022-02-02 DIAGNOSIS — E7800 Pure hypercholesterolemia, unspecified: Secondary | ICD-10-CM

## 2022-02-08 DIAGNOSIS — M9904 Segmental and somatic dysfunction of sacral region: Secondary | ICD-10-CM | POA: Diagnosis not present

## 2022-02-08 DIAGNOSIS — M9901 Segmental and somatic dysfunction of cervical region: Secondary | ICD-10-CM | POA: Diagnosis not present

## 2022-02-08 DIAGNOSIS — M9903 Segmental and somatic dysfunction of lumbar region: Secondary | ICD-10-CM | POA: Diagnosis not present

## 2022-02-08 DIAGNOSIS — M9902 Segmental and somatic dysfunction of thoracic region: Secondary | ICD-10-CM | POA: Diagnosis not present

## 2022-02-20 ENCOUNTER — Other Ambulatory Visit (INDEPENDENT_AMBULATORY_CARE_PROVIDER_SITE_OTHER): Payer: Medicare HMO

## 2022-02-20 DIAGNOSIS — M9902 Segmental and somatic dysfunction of thoracic region: Secondary | ICD-10-CM | POA: Diagnosis not present

## 2022-02-20 DIAGNOSIS — M9901 Segmental and somatic dysfunction of cervical region: Secondary | ICD-10-CM | POA: Diagnosis not present

## 2022-02-20 DIAGNOSIS — M9904 Segmental and somatic dysfunction of sacral region: Secondary | ICD-10-CM | POA: Diagnosis not present

## 2022-02-20 DIAGNOSIS — Z23 Encounter for immunization: Secondary | ICD-10-CM

## 2022-02-20 DIAGNOSIS — M9903 Segmental and somatic dysfunction of lumbar region: Secondary | ICD-10-CM | POA: Diagnosis not present

## 2022-02-21 ENCOUNTER — Encounter: Payer: Self-pay | Admitting: Internal Medicine

## 2022-02-27 ENCOUNTER — Other Ambulatory Visit: Payer: Self-pay | Admitting: *Deleted

## 2022-02-27 ENCOUNTER — Encounter: Payer: Self-pay | Admitting: Family Medicine

## 2022-02-27 ENCOUNTER — Ambulatory Visit (INDEPENDENT_AMBULATORY_CARE_PROVIDER_SITE_OTHER): Payer: Medicare HMO | Admitting: Family Medicine

## 2022-02-27 VITALS — BP 120/70 | HR 76 | Temp 98.2°F | Ht 68.5 in | Wt 184.6 lb

## 2022-02-27 DIAGNOSIS — E7800 Pure hypercholesterolemia, unspecified: Secondary | ICD-10-CM

## 2022-02-27 DIAGNOSIS — M9902 Segmental and somatic dysfunction of thoracic region: Secondary | ICD-10-CM | POA: Diagnosis not present

## 2022-02-27 DIAGNOSIS — M9903 Segmental and somatic dysfunction of lumbar region: Secondary | ICD-10-CM | POA: Diagnosis not present

## 2022-02-27 DIAGNOSIS — Z23 Encounter for immunization: Secondary | ICD-10-CM | POA: Diagnosis not present

## 2022-02-27 DIAGNOSIS — Z9189 Other specified personal risk factors, not elsewhere classified: Secondary | ICD-10-CM

## 2022-02-27 DIAGNOSIS — M9901 Segmental and somatic dysfunction of cervical region: Secondary | ICD-10-CM | POA: Diagnosis not present

## 2022-02-27 DIAGNOSIS — L309 Dermatitis, unspecified: Secondary | ICD-10-CM | POA: Diagnosis not present

## 2022-02-27 DIAGNOSIS — M9904 Segmental and somatic dysfunction of sacral region: Secondary | ICD-10-CM | POA: Diagnosis not present

## 2022-02-27 MED ORDER — ATORVASTATIN CALCIUM 10 MG PO TABS: 10.0000 mg | ORAL_TABLET | Freq: Every day | ORAL | 0 refills | Status: DC

## 2022-02-27 NOTE — Progress Notes (Signed)
   Subjective:    Patient ID: Robin Gilbert, female    DOB: 06-04-1955, 66 y.o.   MRN: [id number]  HPI She notes a rash present on her anterior chest for the last several days.  It is in no other areas of her body.  No new soaps, detergents, colognes   Review of Systems     Objective:   Physical Exam She has a slightly raised and minimally erythematous area in a circular pattern on her anterior upper chest.  The rest of her skin is normal.       Assessment & Plan:  Dermatitis  Need for vaccination against Streptococcus pneumoniae - Plan: Pneumococcal conjugate vaccine 20-valent (Prevnar 20) I explained that the area of the problem is in an area that is exposed to the air and therefore speaks against irritants from close or other kinds of detergents.  Recommend cortisone cream for this and call if no improvement for a more potent steroid.

## 2022-03-06 DIAGNOSIS — H35011 Changes in retinal vascular appearance, right eye: Secondary | ICD-10-CM | POA: Diagnosis not present

## 2022-03-06 DIAGNOSIS — M9901 Segmental and somatic dysfunction of cervical region: Secondary | ICD-10-CM | POA: Diagnosis not present

## 2022-03-06 DIAGNOSIS — M9902 Segmental and somatic dysfunction of thoracic region: Secondary | ICD-10-CM | POA: Diagnosis not present

## 2022-03-06 DIAGNOSIS — Z961 Presence of intraocular lens: Secondary | ICD-10-CM | POA: Diagnosis not present

## 2022-03-06 DIAGNOSIS — M9903 Segmental and somatic dysfunction of lumbar region: Secondary | ICD-10-CM | POA: Diagnosis not present

## 2022-03-06 DIAGNOSIS — M9904 Segmental and somatic dysfunction of sacral region: Secondary | ICD-10-CM | POA: Diagnosis not present

## 2022-03-20 DIAGNOSIS — M9901 Segmental and somatic dysfunction of cervical region: Secondary | ICD-10-CM | POA: Diagnosis not present

## 2022-03-20 DIAGNOSIS — M9902 Segmental and somatic dysfunction of thoracic region: Secondary | ICD-10-CM | POA: Diagnosis not present

## 2022-03-20 DIAGNOSIS — M9904 Segmental and somatic dysfunction of sacral region: Secondary | ICD-10-CM | POA: Diagnosis not present

## 2022-03-20 DIAGNOSIS — M9903 Segmental and somatic dysfunction of lumbar region: Secondary | ICD-10-CM | POA: Diagnosis not present

## 2022-03-29 DIAGNOSIS — M9901 Segmental and somatic dysfunction of cervical region: Secondary | ICD-10-CM | POA: Diagnosis not present

## 2022-03-29 DIAGNOSIS — M9902 Segmental and somatic dysfunction of thoracic region: Secondary | ICD-10-CM | POA: Diagnosis not present

## 2022-03-29 DIAGNOSIS — M9904 Segmental and somatic dysfunction of sacral region: Secondary | ICD-10-CM | POA: Diagnosis not present

## 2022-03-29 DIAGNOSIS — M9903 Segmental and somatic dysfunction of lumbar region: Secondary | ICD-10-CM | POA: Diagnosis not present

## 2022-04-07 DIAGNOSIS — M9902 Segmental and somatic dysfunction of thoracic region: Secondary | ICD-10-CM | POA: Diagnosis not present

## 2022-04-07 DIAGNOSIS — M9901 Segmental and somatic dysfunction of cervical region: Secondary | ICD-10-CM | POA: Diagnosis not present

## 2022-04-07 DIAGNOSIS — M9903 Segmental and somatic dysfunction of lumbar region: Secondary | ICD-10-CM | POA: Diagnosis not present

## 2022-04-07 DIAGNOSIS — M9904 Segmental and somatic dysfunction of sacral region: Secondary | ICD-10-CM | POA: Diagnosis not present

## 2022-04-17 DIAGNOSIS — M9901 Segmental and somatic dysfunction of cervical region: Secondary | ICD-10-CM | POA: Diagnosis not present

## 2022-04-17 DIAGNOSIS — M9902 Segmental and somatic dysfunction of thoracic region: Secondary | ICD-10-CM | POA: Diagnosis not present

## 2022-04-17 DIAGNOSIS — M9903 Segmental and somatic dysfunction of lumbar region: Secondary | ICD-10-CM | POA: Diagnosis not present

## 2022-04-17 DIAGNOSIS — M9904 Segmental and somatic dysfunction of sacral region: Secondary | ICD-10-CM | POA: Diagnosis not present

## 2022-04-28 DIAGNOSIS — M9901 Segmental and somatic dysfunction of cervical region: Secondary | ICD-10-CM | POA: Diagnosis not present

## 2022-04-28 DIAGNOSIS — M9902 Segmental and somatic dysfunction of thoracic region: Secondary | ICD-10-CM | POA: Diagnosis not present

## 2022-04-28 DIAGNOSIS — M9904 Segmental and somatic dysfunction of sacral region: Secondary | ICD-10-CM | POA: Diagnosis not present

## 2022-04-28 DIAGNOSIS — M9903 Segmental and somatic dysfunction of lumbar region: Secondary | ICD-10-CM | POA: Diagnosis not present

## 2022-05-03 DIAGNOSIS — M9901 Segmental and somatic dysfunction of cervical region: Secondary | ICD-10-CM | POA: Diagnosis not present

## 2022-05-03 DIAGNOSIS — M9903 Segmental and somatic dysfunction of lumbar region: Secondary | ICD-10-CM | POA: Diagnosis not present

## 2022-05-03 DIAGNOSIS — M9904 Segmental and somatic dysfunction of sacral region: Secondary | ICD-10-CM | POA: Diagnosis not present

## 2022-05-03 DIAGNOSIS — M9902 Segmental and somatic dysfunction of thoracic region: Secondary | ICD-10-CM | POA: Diagnosis not present

## 2022-05-08 DIAGNOSIS — H26492 Other secondary cataract, left eye: Secondary | ICD-10-CM | POA: Diagnosis not present

## 2022-05-08 DIAGNOSIS — Z961 Presence of intraocular lens: Secondary | ICD-10-CM | POA: Diagnosis not present

## 2022-05-10 DIAGNOSIS — M9904 Segmental and somatic dysfunction of sacral region: Secondary | ICD-10-CM | POA: Diagnosis not present

## 2022-05-10 DIAGNOSIS — M9902 Segmental and somatic dysfunction of thoracic region: Secondary | ICD-10-CM | POA: Diagnosis not present

## 2022-05-10 DIAGNOSIS — M9903 Segmental and somatic dysfunction of lumbar region: Secondary | ICD-10-CM | POA: Diagnosis not present

## 2022-05-10 DIAGNOSIS — M9901 Segmental and somatic dysfunction of cervical region: Secondary | ICD-10-CM | POA: Diagnosis not present

## 2022-05-17 DIAGNOSIS — H26492 Other secondary cataract, left eye: Secondary | ICD-10-CM | POA: Diagnosis not present

## 2022-05-24 DIAGNOSIS — M9903 Segmental and somatic dysfunction of lumbar region: Secondary | ICD-10-CM | POA: Diagnosis not present

## 2022-05-24 DIAGNOSIS — M9902 Segmental and somatic dysfunction of thoracic region: Secondary | ICD-10-CM | POA: Diagnosis not present

## 2022-05-24 DIAGNOSIS — M9904 Segmental and somatic dysfunction of sacral region: Secondary | ICD-10-CM | POA: Diagnosis not present

## 2022-05-24 DIAGNOSIS — M9901 Segmental and somatic dysfunction of cervical region: Secondary | ICD-10-CM | POA: Diagnosis not present

## 2022-05-29 ENCOUNTER — Telehealth (INDEPENDENT_AMBULATORY_CARE_PROVIDER_SITE_OTHER): Payer: Medicare HMO | Admitting: Family Medicine

## 2022-05-29 ENCOUNTER — Encounter: Payer: Self-pay | Admitting: Family Medicine

## 2022-05-29 VITALS — Ht 69.5 in | Wt 170.0 lb

## 2022-05-29 DIAGNOSIS — J069 Acute upper respiratory infection, unspecified: Secondary | ICD-10-CM | POA: Diagnosis not present

## 2022-05-29 NOTE — Progress Notes (Signed)
Start time: 9:43 End time: 9:59  Virtual Visit via Video Note  I connected with Robin Gilbert on 05/29/22 by a video enabled telemedicine application and verified that I am speaking with the correct person using two identifiers.  Location: Patient: home Provider: office   I discussed the limitations of evaluation and management by telemedicine and the availability of in person appointments. The patient expressed understanding and agreed to proceed.  History of Present Illness:  Chief Complaint  Patient presents with   Facial Pain    VIRTUAL sinus pain and pressure, runny nose. Constantly blowing her nose. Started with ST late Friday/early Saturday morning. No fever or HA. Negative covid yesterday.    Started late Friday/early Saturday with sore throat. She then started with runny nose, some cough only at night. She has been drinking a tea for sore throat, taking umcka (homeopathic med). She has also taken claritin--took yesterday morning, seems to have worn off in a few hours.  Nasal drainage is clear.  Rare cough. At night she notices more drainage, which contributes to cough.  She denies any sinus pain or pressure.  No sick contacts. Hasn't been in any large crowds.  PMH, PSH, SH reviewed  Outpatient Encounter Medications as of 05/29/2022  Medication Sig Note   atorvastatin (LIPITOR) 10 MG tablet Take 1 tablet (10 mg total) by mouth daily.    B Complex Vitamins (B COMPLEX 1 PO) B Complex  Take once a day    Ferrous Sulfate (IRON PO)     Homeopathic Products (UMCKA COLDCARE PO) Take 5 mLs by mouth in the morning, at noon, in the evening, and at bedtime. 05/29/2022: Last dose 10pm   loratadine (CLARITIN) 10 MG tablet Take 10 mg by mouth daily. 05/29/2022: Last dose 10am yesterday   NONFORMULARY OR COMPOUNDED ITEM Take 1 capsule by mouth daily. 02/27/2022: Bone Builder   vitamin C (ASCORBIC ACID) 500 MG tablet Take 500 mg by mouth daily. Takes 1-2 tablets per day    VITAMIN D,  CHOLECALCIFEROL, PO Take 1,000 Int'l Units/L by mouth daily. 02/27/2022: liquid   [DISCONTINUED] TURMERIC CURCUMIN PO Take 1 tablet by mouth at bedtime.    [DISCONTINUED] zinc gluconate 50 MG tablet Take 50 mg by mouth daily.    [DISCONTINUED] diphenhydrAMINE-zinc acetate (BENADRYL) cream Apply 1 application  topically in the morning and at bedtime. 02/27/2022: Last dose today   [DISCONTINUED] ECHINACEA EXTRACT PO Take 1 tablet by mouth at bedtime.    No facility-administered encounter medications on file as of 05/29/2022.   Allergies  Allergen Reactions   Amoxicillin Other (See Comments)    Pt states she had to take probiotics when she took this. Did not agree with her    ROS: no fever, chills.  URI symptoms per HPI. No shortness of breath, chest pain, n/v/d.    Observations/Objective:  Ht 5' 9.5" (1.765 m)   Wt 170 lb (77.1 kg)   BMI 24.74 kg/m   Pleasant, well-appearing female in no distress She is alert and oriented, cranial nerves grossly intact No sniffling, throat-clearing or coughing during visit. Exam is limited due to virtual nature of the visit   Assessment and Plan:  Viral upper respiratory tract infection - supportive measures reviewed. S/sx bacterial infection reviewed.  Consider repeat COVID test   Stay well hydrated--drink lots of water. Continue claritin daily. Consider adding a decongestant such as pseudoephedrine (behind the counter at the pharmacy).  You can get the 4 or 12 hour versions and take it  during the day. You may also use guaifenesin and dextromethorphan (found in Robitussin DM or Mucinex DM) and use that twice daily if needed, or just at bedtime since that seems to be when you're coughing.  If you develop sinus pain, that isn't responsive to the pseudoephedrine, then you can also try sinus rinses (neti-pot, or sinus rinse kit).  Contact our office next week if you develop discolored mucus, sinus pain, worsening cough, or other new symptoms.  If  you develop high fever, shortness of breath, pain with breathing, or other new/concerning symptoms, you may need to be re-evaluated in the office.   Follow Up Instructions:    I discussed the assessment and treatment plan with the patient. The patient was provided an opportunity to ask questions and all were answered. The patient agreed with the plan and demonstrated an understanding of the instructions.   The patient was advised to call back or seek an in-person evaluation if the symptoms worsen or if the condition fails to improve as anticipated.  I spent 20 minutes dedicated to the care of this patient, including pre-visit review of records, face to face time, post-visit ordering of testing and documentation.    Vikki Ports, MD

## 2022-05-29 NOTE — Patient Instructions (Signed)
Stay well hydrated--drink lots of water. Continue claritin daily. Consider adding a decongestant such as pseudoephedrine (behind the counter at the pharmacy).  You can get the 4 or 12 hour versions and take it during the day. You may also use guaifenesin and dextromethorphan (found in Robitussin DM or Mucinex DM) and use that twice daily if needed, or just at bedtime since that seems to be when you're coughing.  If you develop sinus pain, that isn't responsive to the pseudoephedrine, then you can also try sinus rinses (neti-pot, or sinus rinse kit).  Contact our office next week if you develop discolored mucus, sinus pain, worsening cough, or other new symptoms.  If you develop high fever, shortness of breath, pain with breathing, or other new/concerning symptoms, you may need to be re-evaluated in the office.

## 2022-05-30 ENCOUNTER — Other Ambulatory Visit: Payer: Self-pay | Admitting: Family Medicine

## 2022-05-30 DIAGNOSIS — E7800 Pure hypercholesterolemia, unspecified: Secondary | ICD-10-CM

## 2022-05-30 DIAGNOSIS — Z9189 Other specified personal risk factors, not elsewhere classified: Secondary | ICD-10-CM

## 2022-06-05 DIAGNOSIS — R0981 Nasal congestion: Secondary | ICD-10-CM | POA: Diagnosis not present

## 2022-06-13 ENCOUNTER — Ambulatory Visit (INDEPENDENT_AMBULATORY_CARE_PROVIDER_SITE_OTHER): Payer: Medicare HMO | Admitting: Medical

## 2022-06-13 VITALS — BP 110/64 | HR 73 | Wt 178.0 lb

## 2022-06-13 DIAGNOSIS — G4700 Insomnia, unspecified: Secondary | ICD-10-CM | POA: Diagnosis not present

## 2022-06-13 DIAGNOSIS — Z820 Family history of epilepsy and other diseases of the nervous system: Secondary | ICD-10-CM | POA: Diagnosis not present

## 2022-06-13 DIAGNOSIS — R413 Other amnesia: Secondary | ICD-10-CM

## 2022-06-13 DIAGNOSIS — F419 Anxiety disorder, unspecified: Secondary | ICD-10-CM | POA: Diagnosis not present

## 2022-06-13 DIAGNOSIS — E7800 Pure hypercholesterolemia, unspecified: Secondary | ICD-10-CM

## 2022-06-13 DIAGNOSIS — M545 Low back pain, unspecified: Secondary | ICD-10-CM

## 2022-06-13 NOTE — Progress Notes (Signed)
Subjective:  Robin Gilbert is a 67 y.o. female who presents for Chief Complaint  Patient presents with   sleep issues    Sleep issues and having back problems- wants to know if she needs MRI      She has problems with sleep.   Usually able to get to sleep initially but awakes around 3-4 am.   Can't get back to sleep. Sometimes awakes and gets up to urinate.  Sometimes just awakes.   By 4-5 am wide awake.    Using app on phone to help with sleep and medication which helps some.  She is forgetting some names, and with mom's hx/o Alzheimer, has concerns about memory.  Sometimes take melatonin.   No prior prescription sleep aid.  Has been under some stress.   Stressed about her expenses, cost of living.  thinking about moving to [place].  Expenses would be lower, medical care there is good.  No other memory concerns.  Learns new tasks at work.  Not eating after 7pm.  No alcohol, no caffiene, eats a light dinner.    Has hx/o back pain, hx/o 2 herniated discs in back. Lately upon waking, has stiffness and pain in right low back.  Takes a little while to resolve.  Wants imaging.    Is currently taking her lipid medicaiton, but has been eating a plant based diet for a while  She is considering moving to [place] given lower costs of living and good medical care.    She is divorced.  No children.   No other aggravating or relieving factors.    No other c/o.  Past Medical History:  Diagnosis Date   Anxiety    Depression    Fibroid    High cholesterol    Pure hypercholesterolemia 01/13/2021   Substance abuse ([facility])    recovering alcohol--sober for 27 years   Thyroid disease    Current Outpatient Medications on File Prior to Visit  Medication Sig Dispense Refill   atorvastatin (LIPITOR) 10 MG tablet TAKE 1 TABLET BY MOUTH EVERY DAY 90 tablet 0   B Complex Vitamins (B COMPLEX 1 PO) B Complex  Take once a day     Ferrous Sulfate (IRON PO)      NONFORMULARY OR COMPOUNDED ITEM Take 1 capsule by  mouth daily.     vitamin C (ASCORBIC ACID) 500 MG tablet Take 500 mg by mouth daily. Takes 1-2 tablets per day     VITAMIN D, CHOLECALCIFEROL, PO Take 1,000 Int'l Units/L by mouth daily.     No current facility-administered medications on file prior to visit.     The following portions of the patient's history were reviewed and updated as appropriate: allergies, current medications, past family history, past medical history, past social history, past surgical history and problem list.  ROS Otherwise as in subjective above  Objective: BP 110/64   Pulse 73   Wt 178 lb (80.7 kg)   BMI 25.91 kg/m   Wt Readings from Last 3 Encounters:  06/13/22 178 lb (80.7 kg)  05/29/22 170 lb (77.1 kg)  02/27/22 184 lb 9.6 oz (83.7 kg)    General appearance: alert, no distress, well developed, well nourished HEENT: normocephalic, sclerae anicteric, conjunctiva pink and moist, TMs pearly, nares patent, no discharge or erythema, pharynx normal Oral cavity: MMM, no lesions Neck: supple, no lymphadenopathy, no thyromegaly, no masses Heart: RRR, normal S1, S2, no murmurs Lungs: CTA bilaterally, no wheezes, rhonchi, or rales Back: nontender, ROM about 90%  of normal, no scoliosis or deformity MSK: legs nontender with normal ROM Pulses: 2+ radial pulses, 2+ pedal pulses, normal cap refill Ext: no edema Neuro: A&O x 3, cn2-12 intact , normal leg strength and sensation, normal heel and toe walk, nonfocal exam    Assessment: Encounter Diagnoses  Name Primary?   Insomnia, unspecified type Yes   Memory change    Right-sided low back pain without sciatica, unspecified chronicity    Family history of Alzheimer's disease    High cholesterol    Anxiety      Plan: Insomnia-we discussed sleep hygiene, strategies to help improve sleep.  She is going to work on [employer], trade [employer], reading the Bible in the evening.  She does not drink caffeine or alcohol.  She declines sleep aid at this time although we discussed  medications  Memory changes likely mild cognitive change-since [place] may be [place]ocating out of the country we will go ahead and refer to neurology for formal evaluation.  Right-sided low back pain-we discussed getting back to exercising and stretching regularly.  She will go for baseline x-ray.  High cholesterol-currently on statin given the memory concern, she will stop statin for a few weeks and see if she notes any changes in memory  Of note she is moving to [place] country and wants to get these evaluations squared away before she moves out of the country.   [name] was seen today for sleep issues.  Diagnoses and all orders for this visit:  Insomnia, unspecified type  Memory change -     Ambulatory referral to Neurology  Right-sided low back pain without sciatica, unspecified chronicity -     DG Lumbar Spine Complete; Future  Family history of Alzheimer's disease  High cholesterol  Anxiety    Follow up: pending xray, referral

## 2022-06-13 NOTE — Patient Instructions (Signed)
Please go to [facility] for your back xray.   Their hours are 8am - 4:30 pm Monday - Friday.  Take your insurance card with you.  [facility] [id number] [place], [place] [address]

## 2022-06-22 ENCOUNTER — Ambulatory Visit
Admission: RE | Admit: 2022-06-22 | Discharge: 2022-06-22 | Disposition: A | Discharge status: Home / Self Care | Payer: Medicare HMO | Source: Ambulatory Visit | Attending: Medical | Admitting: Medical

## 2022-06-22 DIAGNOSIS — M545 Low back pain, unspecified: Secondary | ICD-10-CM | POA: Diagnosis not present

## 2022-06-26 NOTE — Progress Notes (Signed)
Results sent through MyChart

## 2022-06-30 DIAGNOSIS — M9901 Segmental and somatic dysfunction of cervical region: Secondary | ICD-10-CM | POA: Diagnosis not present

## 2022-06-30 DIAGNOSIS — M9903 Segmental and somatic dysfunction of lumbar region: Secondary | ICD-10-CM | POA: Diagnosis not present

## 2022-06-30 DIAGNOSIS — M9904 Segmental and somatic dysfunction of sacral region: Secondary | ICD-10-CM | POA: Diagnosis not present

## 2022-06-30 DIAGNOSIS — M9902 Segmental and somatic dysfunction of thoracic region: Secondary | ICD-10-CM | POA: Diagnosis not present

## 2022-07-24 DIAGNOSIS — M9903 Segmental and somatic dysfunction of lumbar region: Secondary | ICD-10-CM | POA: Diagnosis not present

## 2022-07-24 DIAGNOSIS — M9902 Segmental and somatic dysfunction of thoracic region: Secondary | ICD-10-CM | POA: Diagnosis not present

## 2022-07-24 DIAGNOSIS — M9901 Segmental and somatic dysfunction of cervical region: Secondary | ICD-10-CM | POA: Diagnosis not present

## 2022-07-24 DIAGNOSIS — M9904 Segmental and somatic dysfunction of sacral region: Secondary | ICD-10-CM | POA: Diagnosis not present

## 2022-07-25 ENCOUNTER — Ambulatory Visit: Payer: Medicare HMO | Admitting: Medical

## 2022-07-31 DIAGNOSIS — M9902 Segmental and somatic dysfunction of thoracic region: Secondary | ICD-10-CM | POA: Diagnosis not present

## 2022-07-31 DIAGNOSIS — M9904 Segmental and somatic dysfunction of sacral region: Secondary | ICD-10-CM | POA: Diagnosis not present

## 2022-07-31 DIAGNOSIS — M9901 Segmental and somatic dysfunction of cervical region: Secondary | ICD-10-CM | POA: Diagnosis not present

## 2022-07-31 DIAGNOSIS — M9903 Segmental and somatic dysfunction of lumbar region: Secondary | ICD-10-CM | POA: Diagnosis not present

## 2022-08-01 NOTE — Progress Notes (Deleted)
67 y.o. [id number] Divorced [place] American female here for annual exam.    PCP:     No LMP recorded. Patient is postmenopausal.           Sexually active: {yes no:314532}  The current method of family planning is post menopausal status.    Exercising: {yes no:314532}  {types:19826} Smoker:  former  Health Maintenance: Pap:  06/08/21 neg: HR HPV neg History of abnormal Pap:  no MMG:  10/10/21 Breast Density Cat B, BI-RADS CAT 1 neg Colonoscopy:  06/05/16 BMD:   10/10/21  Result  osteopenia TDaP:  10/23/17 Gardasil:   no HIV: years ago-neg Hep C:01/07/21 neg Screening Labs:  Hb today: ***, Urine today: ***   reports that she quit smoking about 27 years ago. Her smoking use included cigarettes. She has never used smokeless tobacco. She reports that she does not drink alcohol and does not use drugs.  Past Medical History:  Diagnosis Date   Anxiety    Depression    Fibroid    High cholesterol    Pure hypercholesterolemia 01/13/2021   Substance abuse ([facility])    recovering alcohol--sober for 27 years   Thyroid disease     Past Surgical History:  Procedure Laterality Date   bartholins cyst removed  04/10/1998   CATARACT EXTRACTION Bilateral    06/2021   cyst removed     Vagina   UTERINE FIBROID SURGERY      Current Outpatient Medications  Medication Sig Dispense Refill   atorvastatin (LIPITOR) 10 MG tablet TAKE 1 TABLET BY MOUTH EVERY DAY 90 tablet 0   B Complex Vitamins (B COMPLEX 1 PO) B Complex  Take once a day     Ferrous Sulfate (IRON PO)      NONFORMULARY OR COMPOUNDED ITEM Take 1 capsule by mouth daily.     vitamin C (ASCORBIC ACID) 500 MG tablet Take 500 mg by mouth daily. Takes 1-2 tablets per day     VITAMIN D, CHOLECALCIFEROL, PO Take 1,000 Int'l Units/L by mouth daily.     No current facility-administered medications for this visit.    Family History  Problem Relation Age of Onset   Ataxia Neg Hx     Review of Systems  Exam:   There were no vitals taken for  this visit.    General appearance: alert, cooperative and appears stated age Head: normocephalic, without obvious abnormality, atraumatic Neck: no adenopathy, supple, symmetrical, trachea midline and thyroid normal to inspection and palpation Lungs: clear to auscultation bilaterally Breasts: normal appearance, no masses or tenderness, No nipple retraction or dimpling, No nipple discharge or bleeding, No axillary adenopathy Heart: regular rate and rhythm Abdomen: soft, non-tender; no masses, no organomegaly Extremities: extremities normal, atraumatic, no cyanosis or edema Skin: skin color, texture, turgor normal. No rashes or lesions Lymph nodes: cervical, supraclavicular, and axillary nodes normal. Neurologic: grossly normal  Pelvic: External genitalia:  no lesions              No abnormal inguinal nodes palpated.              Urethra:  normal appearing urethra with no masses, tenderness or lesions              Bartholins and Skenes: normal                 Vagina: normal appearing vagina with normal color and discharge, no lesions              Cervix:  no lesions              Pap taken: {yes no:314532} Bimanual Exam:  Uterus:  normal size, contour, position, consistency, mobility, non-tender              Adnexa: no mass, fullness, tenderness              Rectal exam: {yes no:314532}.  Confirms.              Anus:  normal sphincter tone, no lesions  Chaperone was present for exam:  ***  Assessment:   Well woman visit with gynecologic exam.   Plan: Mammogram screening discussed. Self breast awareness reviewed. Pap and HR HPV as above. Guidelines for Calcium, Vitamin D, regular exercise program including cardiovascular and weight bearing exercise.   Follow up annually and prn.   Additional counseling given.  {yes [id number]. _______ minutes face to face time of which over 50% was spent in counseling.    After visit summary provided.

## 2022-08-04 ENCOUNTER — Encounter: Payer: Medicare HMO | Admitting: Physician Assistant

## 2022-08-07 DIAGNOSIS — M9904 Segmental and somatic dysfunction of sacral region: Secondary | ICD-10-CM | POA: Diagnosis not present

## 2022-08-07 DIAGNOSIS — M9901 Segmental and somatic dysfunction of cervical region: Secondary | ICD-10-CM | POA: Diagnosis not present

## 2022-08-07 DIAGNOSIS — M9903 Segmental and somatic dysfunction of lumbar region: Secondary | ICD-10-CM | POA: Diagnosis not present

## 2022-08-07 DIAGNOSIS — M9902 Segmental and somatic dysfunction of thoracic region: Secondary | ICD-10-CM | POA: Diagnosis not present

## 2022-08-08 ENCOUNTER — Encounter: Payer: Self-pay | Admitting: Medical

## 2022-08-08 ENCOUNTER — Other Ambulatory Visit: Payer: Self-pay | Admitting: *Deleted

## 2022-08-08 ENCOUNTER — Ambulatory Visit (INDEPENDENT_AMBULATORY_CARE_PROVIDER_SITE_OTHER): Payer: Medicare HMO | Admitting: Medical

## 2022-08-08 VITALS — BP 110/60 | HR 82 | Ht 69.75 in | Wt 179.0 lb

## 2022-08-08 DIAGNOSIS — I700 Atherosclerosis of aorta: Secondary | ICD-10-CM

## 2022-08-08 DIAGNOSIS — M8580 Other specified disorders of bone density and structure, unspecified site: Secondary | ICD-10-CM

## 2022-08-08 DIAGNOSIS — T466X5A Adverse effect of antihyperlipidemic and antiarteriosclerotic drugs, initial encounter: Secondary | ICD-10-CM | POA: Diagnosis not present

## 2022-08-08 DIAGNOSIS — Z0000 Encounter for general adult medical examination without abnormal findings: Secondary | ICD-10-CM | POA: Insufficient documentation

## 2022-08-08 DIAGNOSIS — E559 Vitamin D deficiency, unspecified: Secondary | ICD-10-CM | POA: Diagnosis not present

## 2022-08-08 DIAGNOSIS — R140 Abdominal distension (gaseous): Secondary | ICD-10-CM | POA: Diagnosis not present

## 2022-08-08 DIAGNOSIS — F418 Other specified anxiety disorders: Secondary | ICD-10-CM | POA: Diagnosis not present

## 2022-08-08 DIAGNOSIS — R319 Hematuria, unspecified: Secondary | ICD-10-CM | POA: Diagnosis not present

## 2022-08-08 DIAGNOSIS — R143 Flatulence: Secondary | ICD-10-CM | POA: Diagnosis not present

## 2022-08-08 DIAGNOSIS — E782 Mixed hyperlipidemia: Secondary | ICD-10-CM | POA: Diagnosis not present

## 2022-08-08 LAB — POCT URINALYSIS DIP (PROADVANTAGE DEVICE)
Bilirubin, UA: NEGATIVE
Glucose, UA: NEGATIVE mg/dL
Ketones, POC UA: NEGATIVE mg/dL
Leukocytes, UA: NEGATIVE
Nitrite, UA: NEGATIVE
Protein Ur, POC: NEGATIVE mg/dL
Specific Gravity, Urine: 1.015
Urobilinogen, Ur: 0.2
pH, UA: 6 (ref 5.0–8.0)

## 2022-08-08 LAB — URINALYSIS, MICROSCOPIC ONLY
Bacteria, UA: NONE SEEN
Casts: NONE SEEN /lpf
Epithelial Cells (non renal): NONE SEEN /hpf (ref 0–10)
RBC, Urine: NONE SEEN /hpf (ref 0–2)
WBC, UA: NONE SEEN /hpf (ref 0–5)

## 2022-08-08 NOTE — Progress Notes (Signed)
Please have Kilya send for urine microscopoic

## 2022-08-08 NOTE — Progress Notes (Signed)
Subjective:   HPI  Robin Gilbert is a 67 y.o. female who presents for Chief Complaint  Patient presents with   Annual Exam    Annual exam, medicare wellness done 01/13/22. (Patient was not told to fast) she said she will come back for NV if needed. Still having right sided lbp pain/stiffness. And had some digestive issues she wanted to discuss.     Patient Care Team: Micheal Sheen, Cleda Mccreedy as PCP - General (Family Medicine) Dr. Charna Elizabeth, GI Dr. Conley Simmonds, gynecology  Concerns: At her recent visit she discontinued statin due to some mental sluggishness and feels like that did cause some problems.  Since stopping the statin she has no problems with her memory and mental sluggishness.  She also saw dietitian recently and has been doing plant-based diet but lately has had some bloating and gas.  Wants some advice on this.  She sees a dietitian again today   Reviewed their medical, surgical, family, social, medication, and allergy history and updated chart as appropriate.  Allergies  Allergen Reactions   Amoxicillin Other (See Comments)    Pt states she had to take probiotics when she took this. Did not agree with her    Past Medical History:  Diagnosis Date   Anxiety    Depression    Fibroid    High cholesterol    Pure hypercholesterolemia 01/13/2021   Substance abuse (HCC)    recovering alcohol--sober for 27 years   Thyroid disease     Current Outpatient Medications on File Prior to Visit  Medication Sig Dispense Refill   B Complex Vitamins (B COMPLEX 1 PO) B Complex  Take once a day     Ferrous Sulfate (IRON PO)      NONFORMULARY OR COMPOUNDED ITEM Take 1 capsule by mouth daily.     vitamin C (ASCORBIC ACID) 500 MG tablet Take 500 mg by mouth daily. Takes 1-2 tablets per day     VITAMIN D, CHOLECALCIFEROL, PO Take 1,000 Int'l Units/L by mouth daily.     No current facility-administered medications on file prior to visit.      Current Outpatient Medications:     B Complex Vitamins (B COMPLEX 1 PO), B Complex  Take once a day, Disp: , Rfl:    Ferrous Sulfate (IRON PO), , Disp: , Rfl:    NONFORMULARY OR COMPOUNDED ITEM, Take 1 capsule by mouth daily., Disp: , Rfl:    vitamin C (ASCORBIC ACID) 500 MG tablet, Take 500 mg by mouth daily. Takes 1-2 tablets per day, Disp: , Rfl:    VITAMIN D, CHOLECALCIFEROL, PO, Take 1,000 Int'l Units/L by mouth daily., Disp: , Rfl:   Family History  Problem Relation Age of Onset   Cancer Mother        breast   Alzheimer's disease Mother    Stroke Father    Diabetes Father    Ataxia Neg Hx     Past Surgical History:  Procedure Laterality Date   bartholins cyst removed  04/10/1998   CATARACT EXTRACTION Bilateral    06/2021   COLONOSCOPY  05/2016   cyst removed     Vagina   UTERINE FIBROID SURGERY      Review of Systems  Constitutional:  Negative for chills, fever, malaise/fatigue and weight loss.  HENT:  Negative for congestion, ear pain, hearing loss, sore throat and tinnitus.   Eyes:  Negative for blurred vision, pain and redness.  Respiratory:  Negative for cough, hemoptysis and shortness  of breath.   Cardiovascular:  Negative for chest pain, palpitations, orthopnea, claudication and leg swelling.  Gastrointestinal:  Positive for abdominal pain. Negative for blood in stool, constipation, diarrhea, nausea and vomiting.       Bloating  Genitourinary:  Negative for dysuria, flank pain, frequency, hematuria and urgency.  Musculoskeletal:  Negative for falls, joint pain and myalgias.  Skin:  Negative for itching and rash.  Neurological:  Negative for dizziness, tingling, speech change, weakness and headaches.  Endo/Heme/Allergies:  Negative for polydipsia. Does not bruise/bleed easily.  Psychiatric/Behavioral:  Negative for depression and memory loss. The patient is not nervous/anxious and does not have insomnia.         Objective:  BP 110/60   Pulse 82   Ht 5' 9.75" (1.772 m)   Wt 179 lb (81.2 kg)    SpO2 98%   BMI 25.87 kg/m   General appearance: alert, no distress, WD/WN, African American female Skin: unremarkable HEENT: normocephalic, conjunctiva/corneas normal, sclerae anicteric, PERRLA, EOMi, nares patent, no discharge or erythema, pharynx normal Oral cavity: MMM, tongue normal, teeth in good repair Neck: supple, no lymphadenopathy, no thyromegaly, no masses, normal ROM, no bruits Chest: non tender, normal shape and expansion Heart: RRR, normal S1, S2, no murmurs Lungs: CTA bilaterally, no wheezes, rhonchi, or rales Abdomen: +bs, soft, non tender, non distended, no masses, no hepatomegaly, no splenomegaly, no bruits Back: non tender, normal ROM, no scoliosis Musculoskeletal: upper extremities non tender, no obvious deformity, normal ROM throughout, lower extremities non tender, no obvious deformity, normal ROM throughout Extremities: no edema, no cyanosis, no clubbing Pulses: 2+ symmetric, upper and lower extremities, normal cap refill Neurological: alert, oriented x 3, CN2-12 intact, strength normal upper extremities and lower extremities, sensation normal throughout, DTRs 2+ throughout, no cerebellar signs, gait normal Psychiatric: normal affect, behavior normal, pleasant  Breast/gyn/rectal - deferred to gynecology     Assessment and Plan :   Encounter Diagnoses  Name Primary?   Annual physical exam    Aortic atherosclerosis (HCC) Yes   Vitamin D deficiency    Mixed hyperlipidemia    Osteopenia, unspecified location    Situational anxiety    Adverse effect of statin    Bloating    Flatulence      This visit was a preventative care visit, also known as wellness visit or routine physical.   Topics typically include healthy lifestyle, diet, exercise, preventative care, vaccinations, sick and well care, proper use of emergency dept and after hours care, as well as other concerns.    Separate significant issues discussed: Aortic atherosclerosis, she is using a  plant-based diet, return soon this week for fasting labs  Statin side effect-mental sluggishness on Lipitor.  We discussed other options for therapy given some mild aortic atherosclerosis on prior scan  Osteopenia-advise weightbearing exercise, vitamin D supplementation, aerobic exercise  Bloating and flatulence-we discussed plant-based diet and making some modifications and keeping a symptom diary   General Recommendations: Continue to return yearly for your annual wellness and preventative care visits.  This gives [place] a chance to discuss healthy lifestyle, exercise, vaccinations, review your chart record, and perform screenings where appropriate.  I recommend you see your eye doctor yearly for routine vision care.  I recommend you see your dentist yearly for routine dental care including hygiene visits twice yearly.   Vaccination recommendations were reviewed Immunization History  Administered Date(s) Administered   COVID-19, mRNA, vaccine(Comirnaty)12 years and older 02/20/2022   Fluad Quad(high Dose 65+) 02/14/2021  Moderna SARS-COV2 Booster Vaccination 02/26/2020   PNEUMOCOCCAL CONJUGATE-20 02/27/2022   Pfizer Covid-19 Vaccine Bivalent Booster 73yrs & up 12/31/2020   Tdap 10/23/2017   Unspecified SARS-COV-2 Vaccination 04/23/2019, 05/16/2019   Zoster Recombinat (Shingrix) 11/03/2017, 02/13/2018    Screening for cancer: Colon cancer screening: You were given ColoFit test to return for hemoccult screening.   This is an intermediate screening done every 3-5 years.  Prior or last colon cancer screen: 2018   Breast cancer screening: You should perform a self breast exam monthly.   We reviewed recommendations for regular mammograms and breast cancer screening. Last mammogram: 10/2021 normal  Cervical cancer screening: We reviewed recommendations for pap smear screening. Last pap 2023 normal   Skin cancer screening: Check your skin regularly for new changes, growing  lesions, or other lesions of concern Come in for evaluation if you have skin lesions of concern.  Lung cancer screening: If you have a greater than 20 pack year history of tobacco use, then you may qualify for lung cancer screening with a chest CT scan.   Please call your insurance company to inquire about coverage for this test.  Pancreatic cancer: no current screening test is available routinely recommended.  (Risk factors: Smoking, overweight or obese, diabetes, chronic pancreatitis, work Nurse, mental health, Solicitor, 37 year old or greater, female greater than female, African-American, family history of pancreatic cancer, hereditary breast, ovarian, melanoma, Lynch, Peutz-jeghers).  We currently don't have screenings for other cancers besides breast, cervical, colon, and lung cancers.  If you have a strong family history of cancer or have other cancer screening concerns, please let me know.    Bone health: Get at least 150 minutes of aerobic exercise weekly Get weight bearing exercise at least once weekly Bone density test:  A bone density test is an imaging test that uses a type of X-ray to measure the amount of calcium and other minerals in your bones. The test may be used to diagnose or screen you for a condition that causes weak or thin bones (osteoporosis), predict your risk for a broken bone (fracture), or determine how well your osteoporosis treatment is working. The bone density test is recommended for females 65 and older, or females or males <65 if certain risk factors such as thyroid disease, long term use of steroids such as for asthma or rheumatological issues, vitamin D deficiency, estrogen deficiency, family history of osteoporosis, self or family history of fragility fracture in first degree relative.  Bone density 2023 showing osteopenia. Work on getting weight bearing and aerobic exercise Continue vitamin D supplement Plan repeat bone density test next year  2025   Heart health: Get at least 150 minutes of aerobic exercise weekly Limit alcohol It is important to maintain a healthy blood pressure and healthy cholesterol numbers  Heart disease screening: Screening for heart disease includes screening for blood pressure, fasting lipids, glucose/diabetes screening, BMI height to weight ratio, reviewed of smoking status, physical activity, and diet.    Goals include blood pressure 120/80 or less, maintaining a healthy lipid/cholesterol profile, preventing diabetes or keeping diabetes numbers under good control, not smoking or using tobacco products, exercising most days per week or at least 150 minutes per week of exercise, and eating healthy variety of fruits and vegetables, healthy oils, and avoiding unhealthy food choices like fried food, fast food, high sugar and high cholesterol foods.    CT coronary calcium test October 2022 showing score 0 but there was some mild aortic atherosclerosis   Vascular disease screening: For  high risk individuals including smokers, diabetes, patients with known heart disease or high blood pressure, kidney disease, and others, screening for vascular disease or atherosclerosis of the arteries is available.  Examples may include carotid ultrasound, abdominal aortic ultrasound, ABI blood flow screening in the legs, thoracic aorta screening.    Medical care options: I recommend you continue to seek care here first for routine care.  We try really hard to have available appointments Monday through Friday daytime hours for sick visits, acute visits, and physicals.  Urgent care should be used for after hours and weekends for significant issues that cannot wait till the next day.  The emergency department should be used for significant potentially life-threatening emergencies.  The emergency department is expensive, can often have long wait times for less significant concerns, so try to utilize primary care, urgent care, or  telemedicine when possible to avoid unnecessary trips to the emergency department.  Virtual visits and telemedicine have been introduced since the pandemic started in 2020, and can be convenient ways to receive medical care.  We offer virtual appointments as well to assist you in a variety of options to seek medical care.   Legal Take the time to do a Last Will and Testament, advanced directives including Healthcare Power of Attorney and Living Will documents.  Do not leave your family with burdens that can be handled ahead of time.   Advanced Directives: I recommend you consider completing a Health Care Power of Attorney and Living Will.   These documents respect your wishes and help alleviate burdens on your loved ones if you were to become terminally ill or be in a position to need those documents enforced.    You can complete Advanced Directives yourself, have them notarized, then have copies made for our office, for you and for anybody you feel should have them in safe keeping.  Or, you can have an attorney prepare these documents.   If you haven't updated your Last Will and Testament in a while, it may be worthwhile having an attorney prepare these documents together and save on some costs.       Spiritual and Emotional Health Keeping a healthy spiritual life can help you better manage your physical health. Your spiritual life can help you to cope with any issues that may arise with your physical health.  Balance can keep [place] healthy and help [place] to recover.  If you are struggling with your spiritual health there are questions that you may want to ask yourself:  What makes me feel most complete? When do I feel most connected to the rest of the world? Where do I find the most inner strength? What am I doing when I feel whole?  Helpful tips: Being in nature. Some people feel very connected and at peace when they are walking outdoors or are outside. Helping others. Some feel the largest  sense of wellbeing when they are of service to others. Being of service can take on many forms. It can be doing volunteer work, being kind to strangers, or offering a hand to a friend in need. Gratitude. Some people find they feel the most connected when they remain grateful. They may make lists of all the things they are grateful for or say a thank you out loud for all they have.    Emotional Health Are you in tune with your emotional health?  Check out this link: http://www.marquez-love.com/   Financial Health Make sure you use a budget for your  personal finances Make sure you are insured against risks (health insurance, life insurance, auto insurance, etc) Save more, spend less Set financial goals If you need help in this area, good resources include counseling through [employer] or other community resources, have a meeting with a Social research officer, government, and a good resource is Salley Slaughter podcast    Jaidan was seen today for annual exam.  Diagnoses and all orders for this visit:  Aortic atherosclerosis (HCC) -     Lipid panel; Future  Annual physical exam -     Comprehensive metabolic panel; Future -     CBC; Future -     Lipid panel; Future -     VITAMIN D 25 Hydroxy (Vit-D Deficiency, Fractures); Future -     Iron, TIBC and Ferritin Panel; Future -     POCT Urinalysis DIP (Proadvantage Device) -     Fecal occult blood, imunochemical([facility])  Vitamin D deficiency -     VITAMIN D 25 Hydroxy (Vit-D Deficiency, Fractures); Future  Mixed hyperlipidemia -     Lipid panel; Future  Osteopenia, unspecified location  Situational anxiety  Adverse effect of statin  Bloating  Flatulence   Return this week for fasting labs  Follow-up pending labs, yearly for physical

## 2022-08-08 NOTE — Addendum Note (Signed)
Addended by: Melonie Florida on: 08/08/2022 04:32 PM   Modules accepted: Orders

## 2022-08-09 NOTE — Progress Notes (Signed)
Results sent through MyChart

## 2022-08-11 ENCOUNTER — Other Ambulatory Visit: Payer: Medicare HMO

## 2022-08-11 DIAGNOSIS — E782 Mixed hyperlipidemia: Secondary | ICD-10-CM | POA: Diagnosis not present

## 2022-08-11 DIAGNOSIS — Z0000 Encounter for general adult medical examination without abnormal findings: Secondary | ICD-10-CM

## 2022-08-11 DIAGNOSIS — I700 Atherosclerosis of aorta: Secondary | ICD-10-CM

## 2022-08-11 DIAGNOSIS — E611 Iron deficiency: Secondary | ICD-10-CM | POA: Diagnosis not present

## 2022-08-11 DIAGNOSIS — E559 Vitamin D deficiency, unspecified: Secondary | ICD-10-CM

## 2022-08-11 LAB — IRON,TIBC AND FERRITIN PANEL

## 2022-08-11 LAB — COMPREHENSIVE METABOLIC PANEL
BUN/Creatinine Ratio: 19 (ref 12–28)
Chloride: 103 mmol/L (ref 96–106)
Creatinine, Ser: 0.95 mg/dL (ref 0.57–1.00)
Potassium: 4.4 mmol/L (ref 3.5–5.2)
eGFR: 66 mL/min/{1.73_m2} (ref >59)

## 2022-08-11 LAB — LIPID PANEL
Cholesterol, Total: 246 mg/dL — ABNORMAL HIGH (ref 100–199)
LDL Chol Calc (NIH): 160 mg/dL — ABNORMAL HIGH (ref 0–99)

## 2022-08-11 LAB — CBC
Hematocrit: 38.4 % (ref 34.0–46.6)
Hemoglobin: 12.6 g/dL (ref 11.1–15.9)
MCH: 28.8 pg (ref 26.6–33.0)
RBC: 4.37 x10E6/uL (ref 3.77–5.28)
RDW: 12.4 % (ref 11.7–15.4)

## 2022-08-11 LAB — VITAMIN D 25 HYDROXY (VIT D DEFICIENCY, FRACTURES)

## 2022-08-12 LAB — COMPREHENSIVE METABOLIC PANEL
ALT: 16 IU/L (ref 0–32)
AST: 22 IU/L (ref 0–40)
Albumin/Globulin Ratio: 1.7 (ref 1.2–2.2)
Albumin: 4.3 g/dL (ref 3.9–4.9)
Alkaline Phosphatase: 63 IU/L (ref 44–121)
BUN: 18 mg/dL (ref 8–27)
Bilirubin Total: 0.8 mg/dL (ref 0.0–1.2)
CO2: 22 mmol/L (ref 20–29)
Calcium: 9.9 mg/dL (ref 8.7–10.3)
Globulin, Total: 2.6 g/dL (ref 1.5–4.5)
Glucose: 79 mg/dL (ref 70–99)
Sodium: 140 mmol/L (ref 134–144)
Total Protein: 6.9 g/dL (ref 6.0–8.5)

## 2022-08-12 LAB — CBC
MCHC: 32.8 g/dL (ref 31.5–35.7)
MCV: 88 fL (ref 79–97)
Platelets: 300 10*3/uL (ref 150–450)
WBC: 4.9 10*3/uL (ref 3.4–10.8)

## 2022-08-12 LAB — LIPID PANEL
Chol/HDL Ratio: 3.2 ratio (ref 0.0–4.4)
HDL: 76 mg/dL (ref >39)
Triglycerides: 63 mg/dL (ref 0–149)
VLDL Cholesterol Cal: 10 mg/dL (ref 5–40)

## 2022-08-12 LAB — IRON,TIBC AND FERRITIN PANEL
Ferritin: 137 ng/mL (ref 15–150)
Iron Saturation: 35 % (ref 15–55)
Total Iron Binding Capacity: 280 ug/dL (ref 250–450)
UIBC: 183 ug/dL (ref 118–369)

## 2022-08-14 NOTE — Progress Notes (Signed)
Results sent through MyChart

## 2022-08-15 ENCOUNTER — Ambulatory Visit: Payer: Medicare HMO | Admitting: Obstetrics and Gynecology

## 2022-08-16 NOTE — Progress Notes (Unsigned)
67 y.o. G15P0020 Divorced [place] American female here for annual exam.    PCP:     No LMP recorded. Patient is postmenopausal.           Sexually active: {yes no:314532}  The current method of family planning is post menopausal status.    Exercising: {yes no:314532}  {types:19826} Smoker:  former  Health Maintenance: Pap: 06/08/21 neg: HR HPV neg, 2020 normal per pt History of abnormal Pap:  no MMG:  10/10/21 Breast Density CAT B, BI-RADS CAT 1 neg Colonoscopy:  2019 normal BMD:   years ago  Result  normal TDaP:  PCP Gardasil:   no HIV: years ago neg Hep C: 01/07/21 neg Screening Labs:  Hb today: ***, Urine today: ***   reports that she quit smoking about 27 years ago. Her smoking use included cigarettes. She has never used smokeless tobacco. She reports that she does not drink alcohol and does not use drugs.  Past Medical History:  Diagnosis Date   Anxiety    Depression    Fibroid    High cholesterol    Pure hypercholesterolemia 01/13/2021   Substance abuse (HCC)    recovering alcohol--sober for 27 years   Thyroid disease     Past Surgical History:  Procedure Laterality Date   bartholins cyst removed  04/10/1998   CATARACT EXTRACTION Bilateral    06/2021   COLONOSCOPY  05/2016   cyst removed     Vagina   UTERINE FIBROID SURGERY      Current Outpatient Medications  Medication Sig Dispense Refill   B Complex Vitamins (B COMPLEX 1 PO) B Complex  Take once a day     Ferrous Sulfate (IRON PO)      NONFORMULARY OR COMPOUNDED ITEM Take 1 capsule by mouth daily.     vitamin C (ASCORBIC ACID) 500 MG tablet Take 500 mg by mouth daily. Takes 1-2 tablets per day     VITAMIN D, CHOLECALCIFEROL, PO Take 1,000 Int'l Units/L by mouth daily.     No current facility-administered medications for this visit.    Family History  Problem Relation Age of Onset   Cancer Mother        breast   Alzheimer's disease Mother    Stroke Father    Diabetes Father    Ataxia Neg Hx      Review of Systems  Exam:   There were no vitals taken for this visit.    General appearance: alert, cooperative and appears stated age Head: normocephalic, without obvious abnormality, atraumatic Neck: no adenopathy, supple, symmetrical, trachea midline and thyroid normal to inspection and palpation Lungs: clear to auscultation bilaterally Breasts: normal appearance, no masses or tenderness, No nipple retraction or dimpling, No nipple discharge or bleeding, No axillary adenopathy Heart: regular rate and rhythm Abdomen: soft, non-tender; no masses, no organomegaly Extremities: extremities normal, atraumatic, no cyanosis or edema Skin: skin color, texture, turgor normal. No rashes or lesions Lymph nodes: cervical, supraclavicular, and axillary nodes normal. Neurologic: grossly normal  Pelvic: External genitalia:  no lesions              No abnormal inguinal nodes palpated.              Urethra:  normal appearing urethra with no masses, tenderness or lesions              Bartholins and Skenes: normal                 Vagina: normal  appearing vagina with normal color and discharge, no lesions              Cervix: no lesions              Pap taken: {yes no:314532} Bimanual Exam:  Uterus:  normal size, contour, position, consistency, mobility, non-tender              Adnexa: no mass, fullness, tenderness              Rectal exam: {yes no:314532}.  Confirms.              Anus:  normal sphincter tone, no lesions  Chaperone was present for exam:  ***  Assessment:   Well woman visit with gynecologic exam.   Plan: Mammogram screening discussed. Self breast awareness reviewed. Pap and HR HPV as above. Guidelines for Calcium, Vitamin D, regular exercise program including cardiovascular and weight bearing exercise.   Follow up annually and prn.   Additional counseling given.  {yes [id number]. _______ minutes face to face time of which over 50% was spent in counseling.    After  visit summary provided.

## 2022-08-17 ENCOUNTER — Ambulatory Visit (INDEPENDENT_AMBULATORY_CARE_PROVIDER_SITE_OTHER): Payer: Medicare HMO | Admitting: Obstetrics and Gynecology

## 2022-08-17 ENCOUNTER — Encounter: Payer: Self-pay | Admitting: Obstetrics and Gynecology

## 2022-08-17 VITALS — BP 122/76 | HR 79 | Ht 70.0 in | Wt 179.0 lb

## 2022-08-17 DIAGNOSIS — Z719 Counseling, unspecified: Secondary | ICD-10-CM

## 2022-08-17 DIAGNOSIS — Z01419 Encounter for gynecological examination (general) (routine) without abnormal findings: Secondary | ICD-10-CM | POA: Diagnosis not present

## 2022-08-17 DIAGNOSIS — D1721 Benign lipomatous neoplasm of skin and subcutaneous tissue of right arm: Secondary | ICD-10-CM

## 2022-08-17 NOTE — Patient Instructions (Signed)

## 2022-08-18 DIAGNOSIS — M9901 Segmental and somatic dysfunction of cervical region: Secondary | ICD-10-CM | POA: Diagnosis not present

## 2022-08-18 DIAGNOSIS — M9904 Segmental and somatic dysfunction of sacral region: Secondary | ICD-10-CM | POA: Diagnosis not present

## 2022-08-18 DIAGNOSIS — M9903 Segmental and somatic dysfunction of lumbar region: Secondary | ICD-10-CM | POA: Diagnosis not present

## 2022-08-18 DIAGNOSIS — M9902 Segmental and somatic dysfunction of thoracic region: Secondary | ICD-10-CM | POA: Diagnosis not present

## 2022-08-22 ENCOUNTER — Ambulatory Visit (INDEPENDENT_AMBULATORY_CARE_PROVIDER_SITE_OTHER): Payer: Medicare HMO | Admitting: Medical

## 2022-08-22 VITALS — BP 120/70 | HR 71 | Wt 182.6 lb

## 2022-08-22 DIAGNOSIS — E559 Vitamin D deficiency, unspecified: Secondary | ICD-10-CM | POA: Diagnosis not present

## 2022-08-22 DIAGNOSIS — E7800 Pure hypercholesterolemia, unspecified: Secondary | ICD-10-CM

## 2022-08-22 DIAGNOSIS — I700 Atherosclerosis of aorta: Secondary | ICD-10-CM

## 2022-08-22 MED ORDER — ROSUVASTATIN CALCIUM 5 MG PO TABS: 5.0000 mg | ORAL_TABLET | ORAL | 1 refills | Status: AC

## 2022-08-22 NOTE — Progress Notes (Signed)
Subjective:  Robin Gilbert is a 67 y.o. female who presents for Chief Complaint  Patient presents with   Consult    Discuss labs results. Discuss options     Here to discuss recent blood work showing LDL elevated since she has stopped a statin.  She stopped Lipitor due to brain fog, mental cloudiness.  After stopping the medicine the symptoms went away completely.  She was has not started any more statins.  She has not been on any other lipid-lowering medication in the past.  She is eating a good amount of fish but mostly plant-based diet  She was on vitamin D but recently stopped it when her level was high  She has been continuing to take iron supplement daily   No other aggravating or relieving factors.    No other c/o.  Past Medical History:  Diagnosis Date   Anxiety    Depression    Fibroid    High cholesterol    Pure hypercholesterolemia 01/13/2021   Substance abuse (HCC)    recovering alcohol--sober for 27 years   Thyroid disease     Current Outpatient Medications on File Prior to Visit  Medication Sig Dispense Refill   B Complex Vitamins (B COMPLEX 1 PO) B Complex  Take once a day     Ferrous Sulfate (IRON PO)      NONFORMULARY OR COMPOUNDED ITEM Take 1 capsule by mouth daily.     vitamin C (ASCORBIC ACID) 500 MG tablet Take 500 mg by mouth daily. Takes 1-2 tablets per day     No current facility-administered medications on file prior to visit.    Family History  Problem Relation Age of Onset   Cancer Mother        breast   Alzheimer's disease Mother    Stroke Father    Diabetes Father    Ataxia Neg Hx    The following portions of the patient's history were reviewed and updated as appropriate: allergies, current medications, past family history, past medical history, past social history, past surgical history and problem list.  ROS Otherwise as in subjective above  Objective: BP 120/70   Pulse 71   Wt 182 lb 9.6 oz (82.8 kg)   BMI 26.20 kg/m    General appearance: alert, no distress, well developed, well nourished    Assessment: Encounter Diagnoses  Name Primary?   Pure hypercholesterolemia Yes   Aortic atherosclerosis (HCC)    Vitamin D deficiency      Plan: I reviewed her lab work.  Although she has had low vitamin D in the past, her recent vitamin D was over the normal range.  She also recently changed to much more plant-based diet.  Advise she stop vitamin D supplement discontinue healthy diet at this point  Similarly her iron was recently normal so discontinue iron supplement  Pure hypercholesterolemia-she did not tolerate Lipitor specifically.  We discussed her risk factors for heart disease which includes age over 66 and hyperlipidemia.  She does have family history of father with stroke but he was diabetic as well.  She is a non-smoker.  Her atherosclerosis cardiac risk score is 9.5% in the next 10 years.  Her CT coronary test from 2022 showed no coronary artery sclerosis but mild aortic atherosclerosis.  We discussed options for therapy.  She will continue with healthy diet.  She also starts her recent supplements per her chiropractor.  She is agreeable to low-dose Crestor.  We discussed all the different classes of  lipid-lowering medications.  We discussed relative risk and benefits of medications as well  Robin Gilbert was seen today for consult.  Diagnoses and all orders for this visit:  Pure hypercholesterolemia  Aortic atherosclerosis (HCC)  Vitamin D deficiency  Other orders -     rosuvastatin (CRESTOR) 5 MG tablet; Take 1 tablet (5 mg total) by mouth every other day.    Follow up: 70mo

## 2022-08-27 ENCOUNTER — Other Ambulatory Visit: Payer: Self-pay | Admitting: Nurse Practitioner

## 2022-08-27 DIAGNOSIS — Z9189 Other specified personal risk factors, not elsewhere classified: Secondary | ICD-10-CM

## 2022-08-27 DIAGNOSIS — E7800 Pure hypercholesterolemia, unspecified: Secondary | ICD-10-CM

## 2022-09-18 DIAGNOSIS — M9902 Segmental and somatic dysfunction of thoracic region: Secondary | ICD-10-CM | POA: Diagnosis not present

## 2022-09-18 DIAGNOSIS — M9903 Segmental and somatic dysfunction of lumbar region: Secondary | ICD-10-CM | POA: Diagnosis not present

## 2022-09-18 DIAGNOSIS — M9904 Segmental and somatic dysfunction of sacral region: Secondary | ICD-10-CM | POA: Diagnosis not present

## 2022-09-18 DIAGNOSIS — M9901 Segmental and somatic dysfunction of cervical region: Secondary | ICD-10-CM | POA: Diagnosis not present

## 2022-09-29 ENCOUNTER — Telehealth: Payer: Self-pay | Admitting: Medical

## 2022-09-29 NOTE — Telephone Encounter (Signed)
[place], [place] she needs a report emailed to her about her cholesterol, her catarac surgery that happened in 2022 or 2023 she couldn't remember exactly and also last physical results. She said she can't get into her My Chart because she is out of [place] and she needs this information to be able to get emergency insurance where she is located.  Email: [email or web address]

## 2023-01-18 IMAGING — CT CT CHEST W/ CM
1 series · 15 of 34 positions shown, 19 images · IV contrast (iopamidol)
Comparison: None.

CLINICAL DATA: Evaluate right axillary mass

EXAM:
CT CHEST WITH CONTRAST
TECHNIQUE: Multidetector CT imaging of the chest was performed during
intravenous contrast administration.
CONTRAST:  75mL TOOPGU-QRR IOPAMIDOL (TOOPGU-QRR) INJECTION 61%

[Series 2: chest w/cm · axial · 0.88mm/px · z∈[-348,-62]mm · 15 of 169 slices shown, 19 images]
[im 13/169  mediastinal]
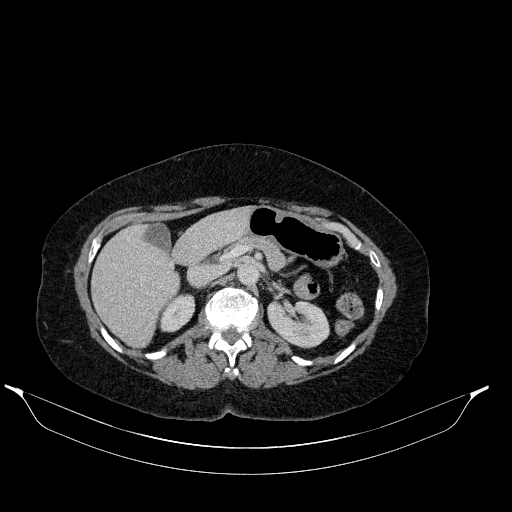
[im 13/169  lung]
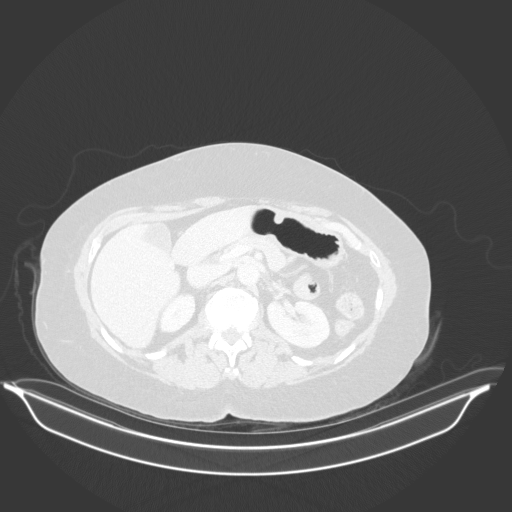
[im 25/169  lung]
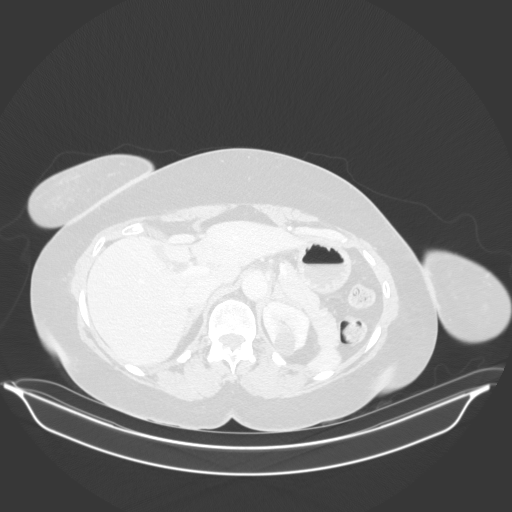
[im 34/169  lung]
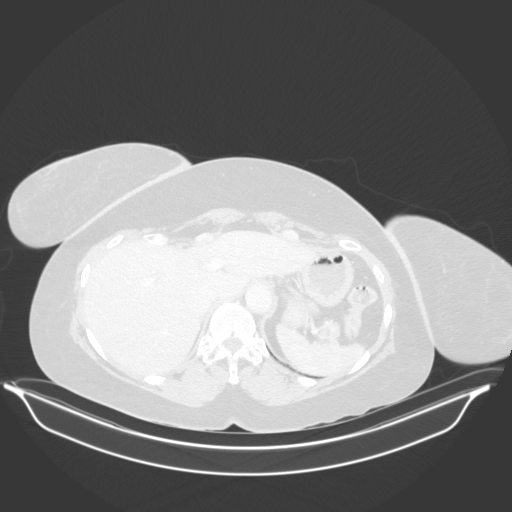
[im 44/169  lung]
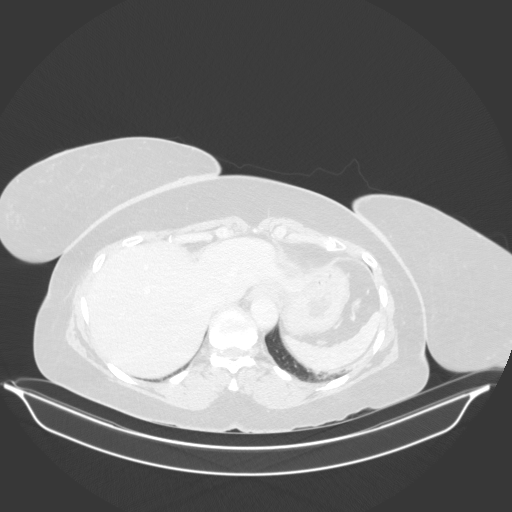
[im 57/169  mediastinal]
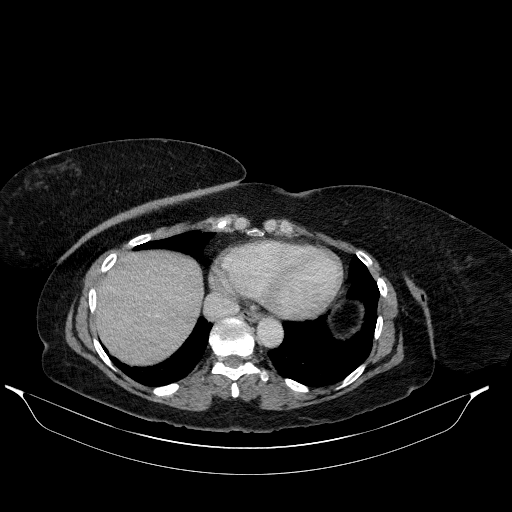
[im 57/169  lung]
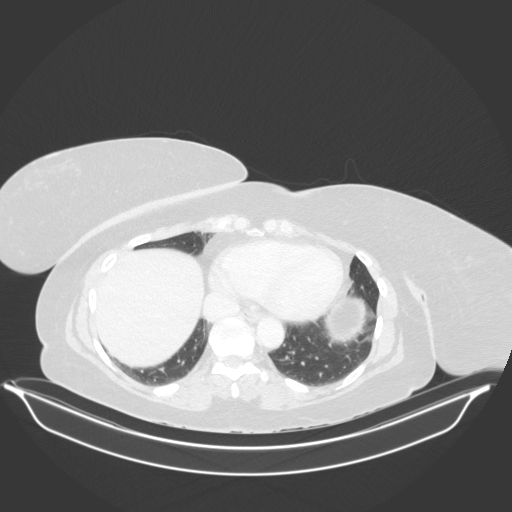
[im 68/169  lung]
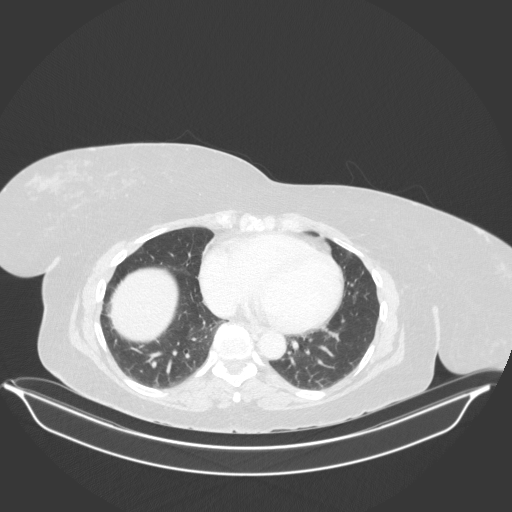
[im 75/169  lung]
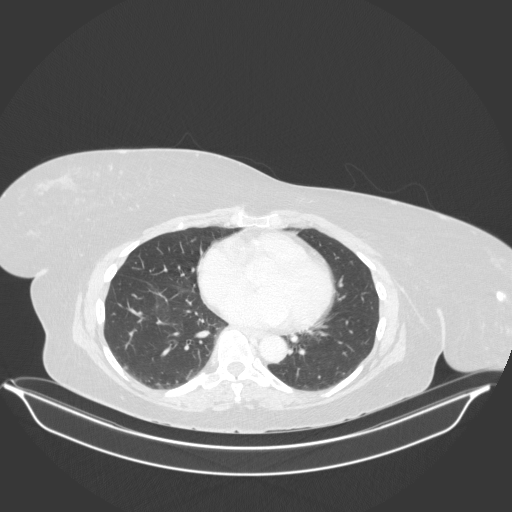
[im 88/169  lung]
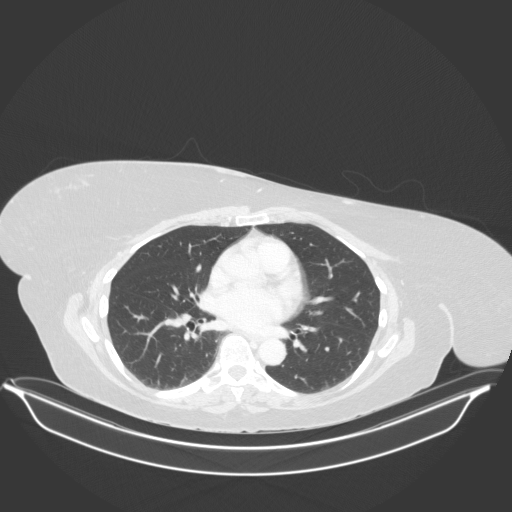
[im 94/169  mediastinal]
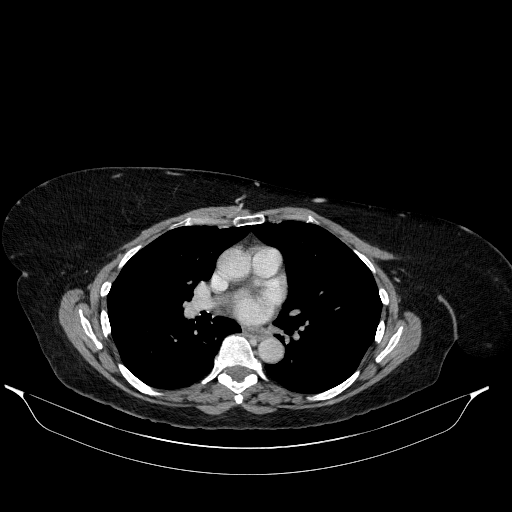
[im 94/169  lung]
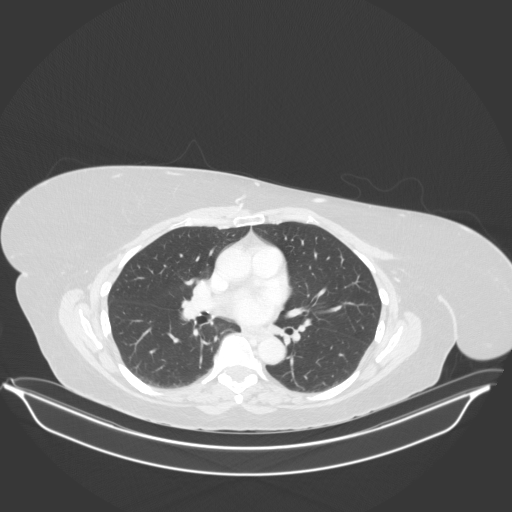
[im 101/169  lung]
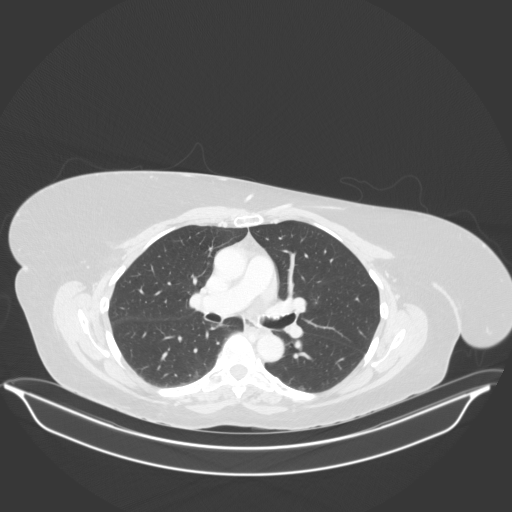
[im 113/169  lung]
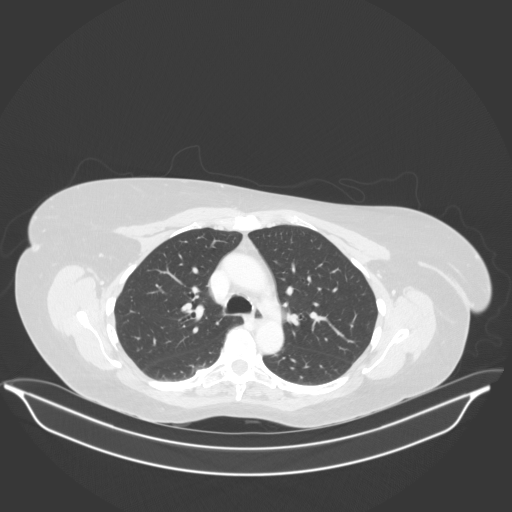
[im 125/169  lung]
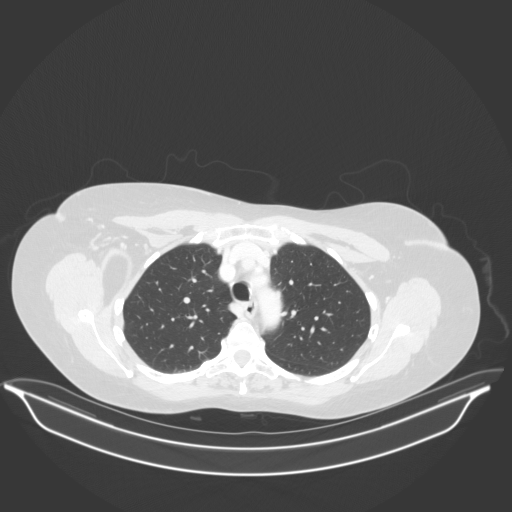
[im 135/169  mediastinal]
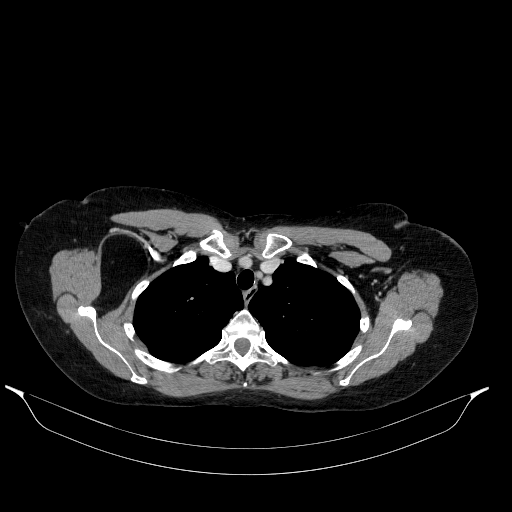
[im 135/169  lung]
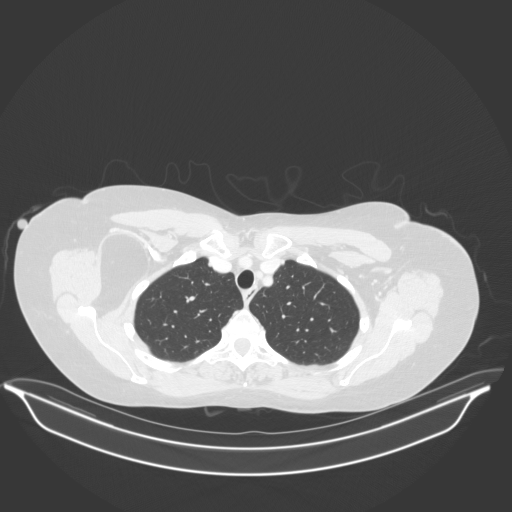
[im 144/169  lung]
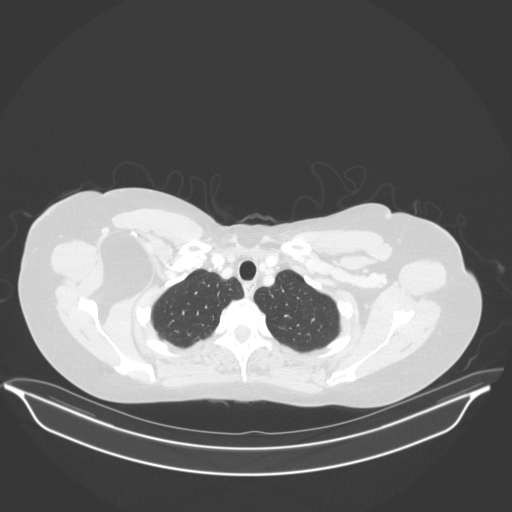
[im 156/169  lung]
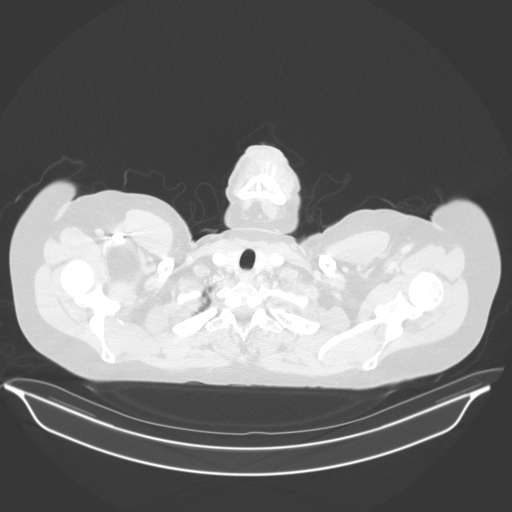

[15 of 34 positions shown; findings below may reference images not displayed]

FINDINGS: Cardiovascular: Normal heart size. No pericardial effusion. No
significant coronary artery calcifications. Minimal atherosclerotic
disease of the thoracic aorta.

Mediastinum/Nodes: No pathologically enlarged lymph nodes in the
chest. Esophagus and thyroid are unremarkable.

Lungs/Pleura: No consolidation, pleural effusion or pneumothorax.
Small solid pulmonary nodule of the right lower lobe measuring 3 mm
on series 3, image 94.

Upper Abdomen: Simple cyst of the left kidney. No acute abnormality.

Musculoskeletal: Fat containing mass of the right axillary region
measuring approximately 5.9 x 6.7 x 7.1 cm a minimal amount of soft
tissue is seen within the mass. No acute or significant osseous
findings.
IMPRESSION: Large fat containing mass of the right axilla, compatible with a
lipoma.

Solid pulmonary nodule in the right lower lobe measuring 3 mm. No
follow-up needed if patient is low-risk. Non-contrast chest CT can
be considered in 12 months if patient is high-risk. This
recommendation follows the consensus statement: Guidelines for
Management of Incidental Pulmonary Nodules Detected on CT Images:

## 2023-02-12 IMAGING — CT CT CARDIAC CORONARY ARTERY CALCIUM SCORE
3 series · 13 of 20 positions shown, 15 images · non-contrast
Comparison: 01/10/2021

CLINICAL DATA: 64-year-old African American female with pure
hypercholesterolemia.

EXAM:
CT CARDIAC CORONARY ARTERY CALCIUM SCORE
TECHNIQUE: Non-contrast imaging through the heart was performed using
prospective ECG gating. Image post processing was performed on an
independent workstation, allowing for quantitative analysis of the
heart and coronary arteries. Note that this exam targets the heart
and the chest was not imaged in its entirety.

[Series 2: calcium scoring 2.00 qr36 bestdiast 69% hrt calciu · axial · 0.35mm/px · z∈[+1740,+1804]mm · 3 of 80 slices shown]
[im 16/80  vessel]
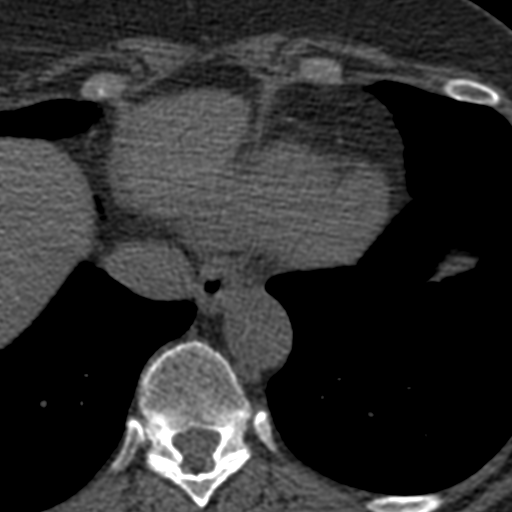
[im 32/80  vessel]
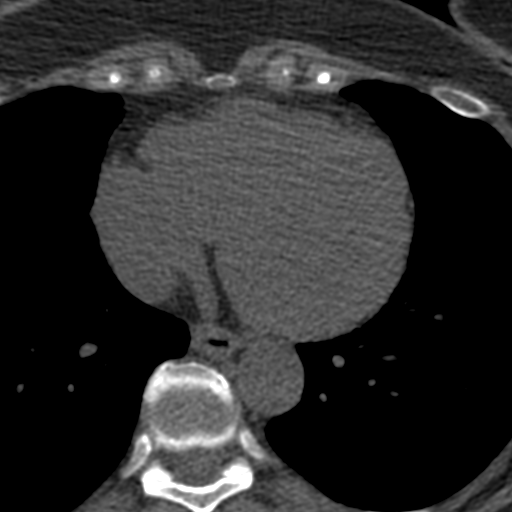
[im 48/80  vessel]
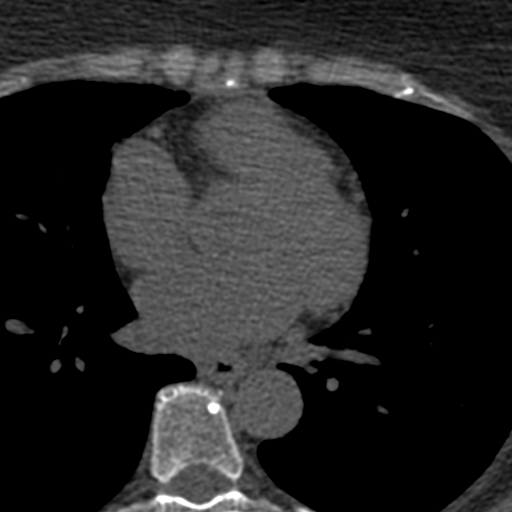

[Series 3: calcium scoring 2.00 br40 bestdiast 69% axial · axial · 0.56mm/px · z∈[+1735,+1839]mm · 5 of 80 slices shown, 7 images]
[im 14/80  vessel]
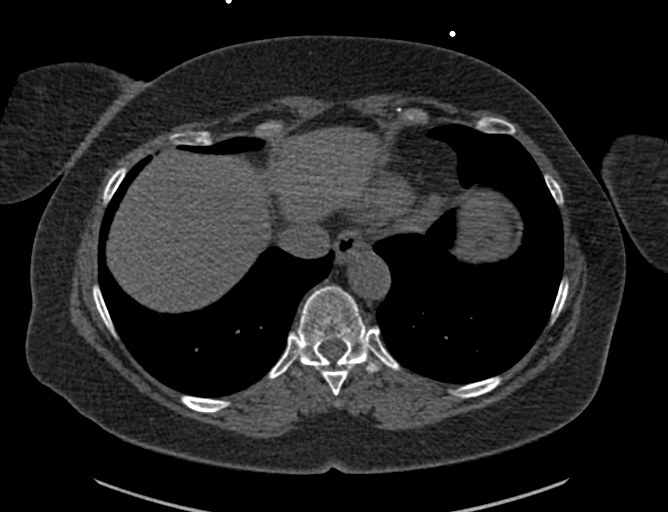
[im 14/80  lung]
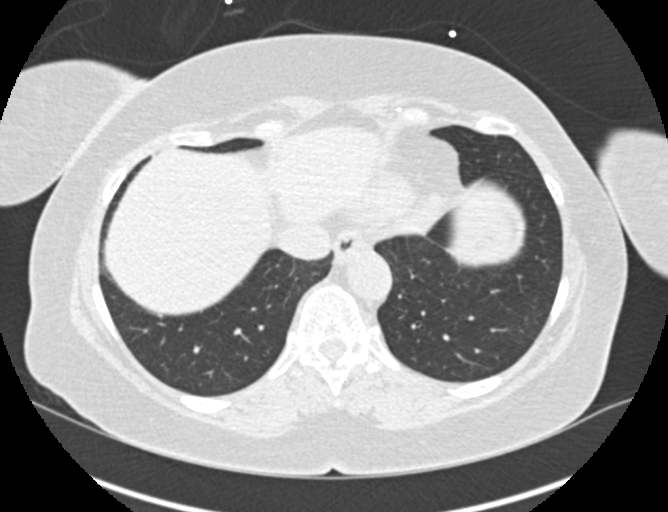
[im 27/80  vessel]
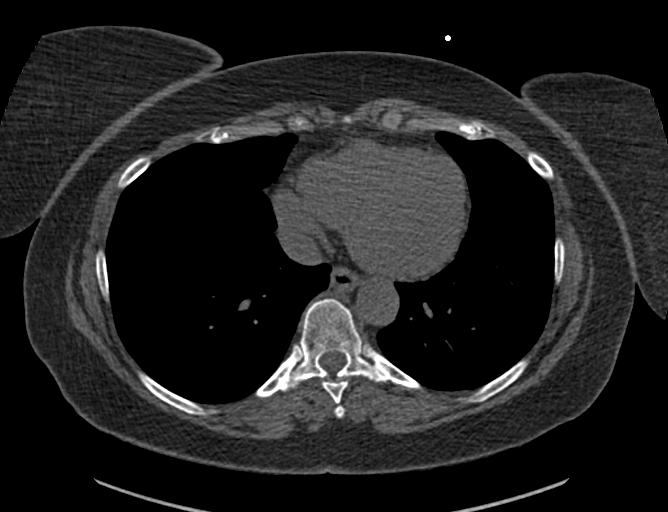
[im 40/80  vessel]
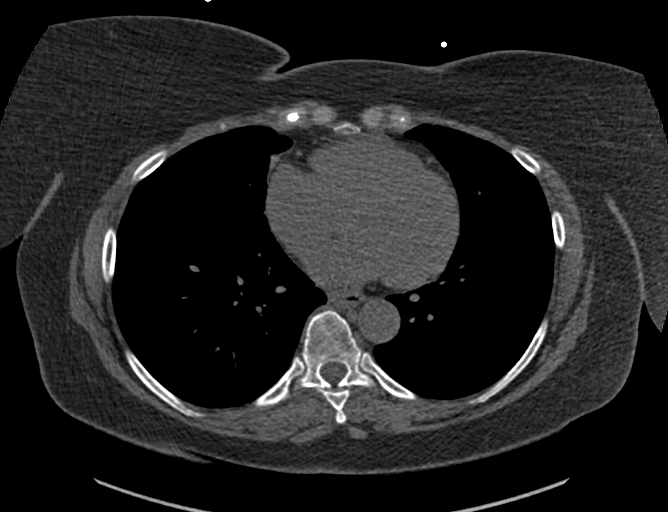
[im 53/80  vessel]
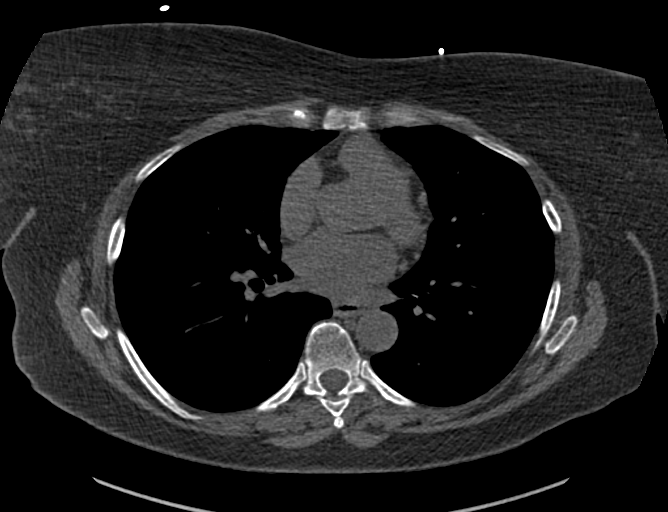
[im 66/80  vessel]
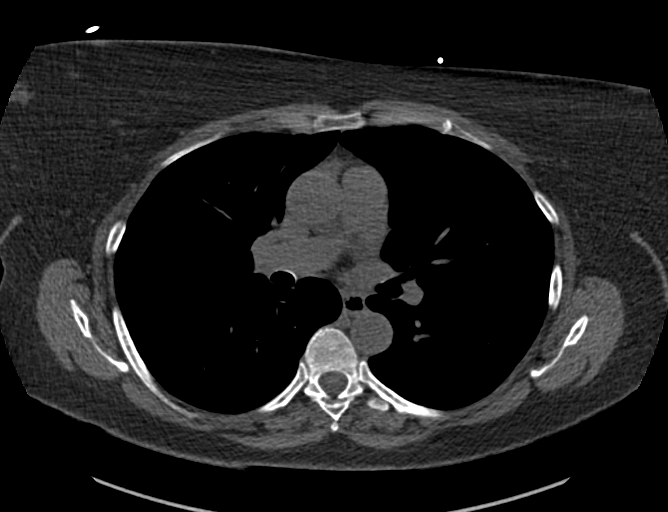
[im 66/80  lung]
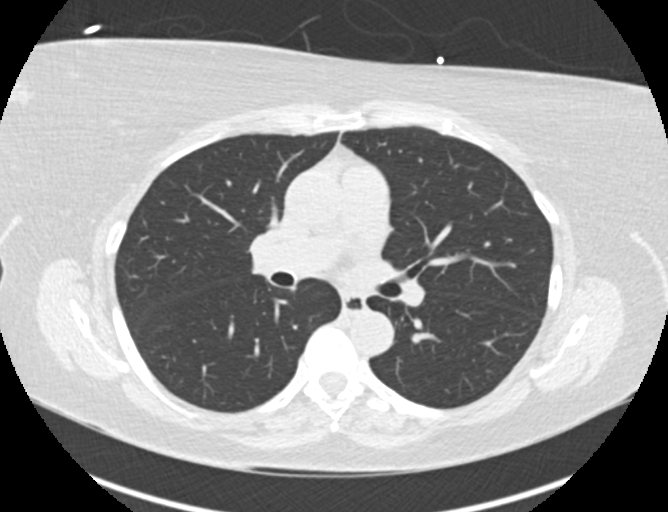

[Series 9: calcium scoring 2.00 br60 bestdiast 69% lungs · axial · 0.53mm/px · z∈[+1736,+1840]mm · 5 of 80 slices shown]
[im 14/80  vessel]
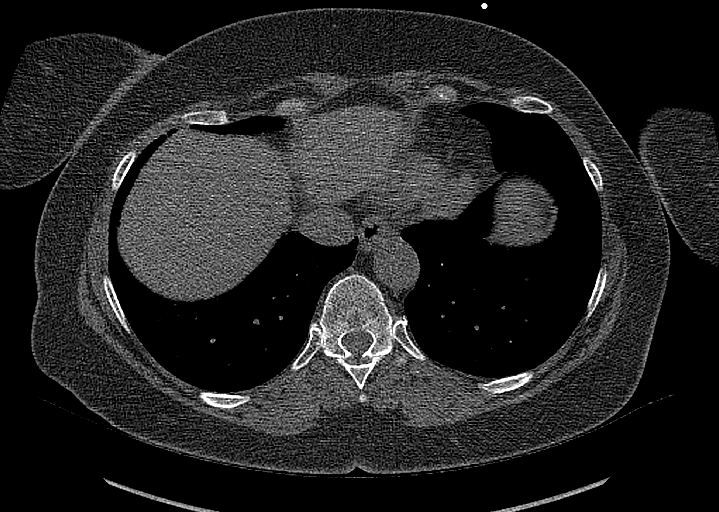
[im 27/80  vessel]
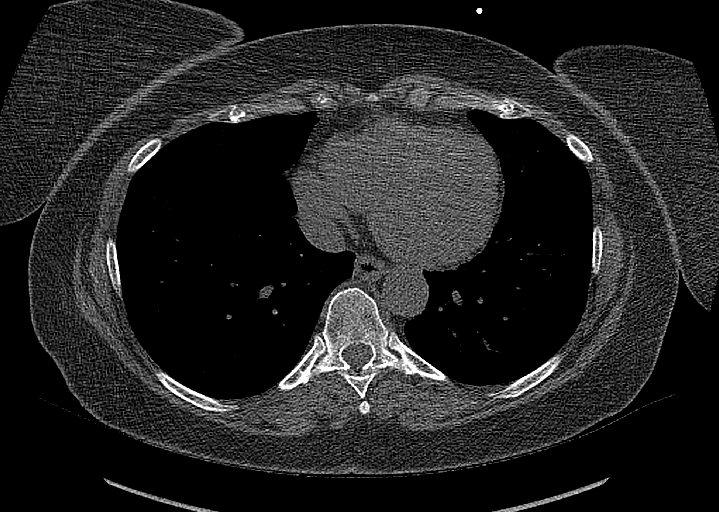
[im 40/80  vessel]
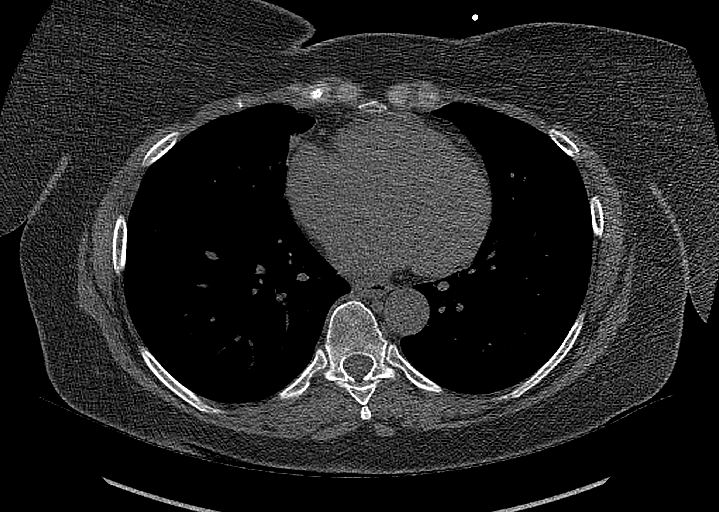
[im 53/80  vessel]
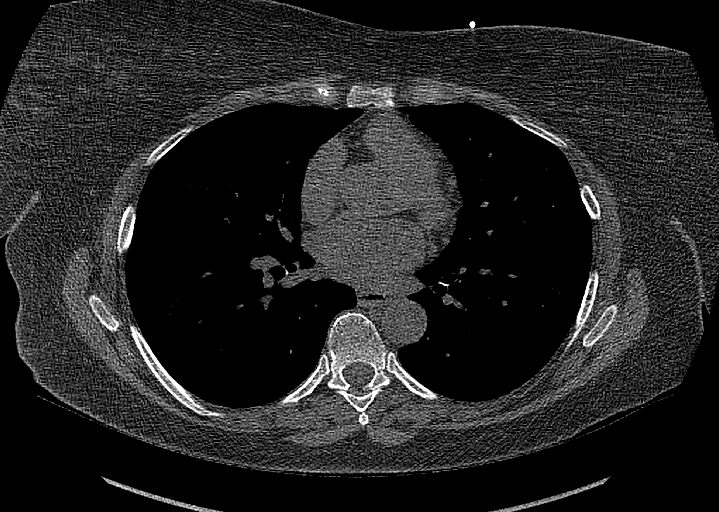
[im 66/80  vessel]
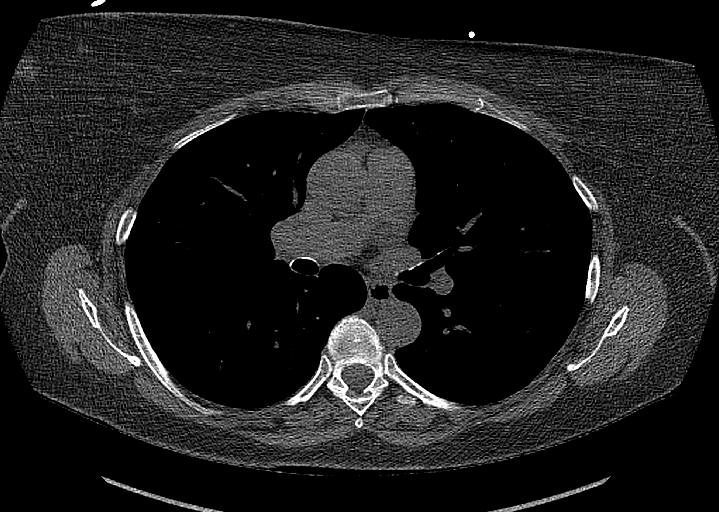

[13 of 20 positions shown; findings below may reference images not displayed]

FINDINGS: CORONARY CALCIUM SCORES:

Left Main: 0

LAD: 0

LCx: 0

RCA: 0

Total Agatston Score: 0

[HOSPITAL] percentile: 0

AORTA MEASUREMENTS:

Ascending Aorta: 30 mm

Descending Aorta: 25 mm

OTHER FINDINGS:

Small amount of calcium associated with the aortic root. Heart size
is normal. Visualized mediastinal structures are normal. Images of
the upper abdomen are unremarkable. 2 mm nodule in the right middle
lobe on sequence 9 image 54. 3 mm subpleural nodule in the right
lower lobe on image 32. Peripheral nodule in the right lower lobe
measures 4 mm on image 53 and similar to the recent comparison
examination. No large pleural effusions. No large areas of airspace
disease or lung consolidation. Tiny nodule in the lingula on image
43. No acute bone abnormality.
IMPRESSION: 1. Coronary calcium score is 0.
2. Aortic Atherosclerosis (UTCD4-X98.8). Small amount of calcium at
the aortic root.
3. Small pulmonary nodules in the visualized lungs, largest measures
4 mm in the right lower lobe. These small nodules are indeterminate.
No follow-up needed if patient is low-risk (and has no known or
suspected primary neoplasm). Non-contrast chest CT can be considered
in 12 months if patient is high-risk. This recommendation follows
the consensus statement: Guidelines for Management of Incidental
Pulmonary Nodules Detected on CT Images: From the [HOSPITAL]

## 2023-05-28 ENCOUNTER — Ambulatory Visit (INDEPENDENT_AMBULATORY_CARE_PROVIDER_SITE_OTHER): Payer: Medicare HMO

## 2023-05-28 VITALS — Ht 70.0 in | Wt 182.0 lb

## 2023-05-28 DIAGNOSIS — Z0000 Encounter for general adult medical examination without abnormal findings: Secondary | ICD-10-CM

## 2023-05-28 NOTE — Progress Notes (Signed)
 Subjective:   Robin Gilbert is a 68 y.o. female who presents for Medicare Annual (Subsequent) preventive examination.  Visit Complete: Virtual I connected with  Danella Penton on 05/28/23 by a audio enabled telemedicine application and verified that I am speaking with the correct person using two identifiers.  Patient Location: Home  Provider Location: Home Office  I discussed the limitations of evaluation and management by telemedicine. The patient expressed understanding and agreed to proceed.  Vital Signs: Because this visit was a virtual/telehealth visit, some criteria may be missing or patient reported. Any vitals not documented were not able to be obtained and vitals that have been documented are patient reported.    Cardiac Risk Factors include: advanced age (>80men, >13 women)     Objective:    Today's Vitals   05/28/23 0806  Weight: 182 lb (82.6 kg)  Height: 5\' 10"  (1.778 m)   Body mass index is 26.11 kg/m.     05/28/2023    8:12 AM 01/13/2022    9:32 AM  Advanced Directives  Does Patient Have a Medical Advance Directive? Yes Yes  Type of Estate agent of [place];Living will Out of facility DNR (pink MOST or yellow form)  Does patient want to make changes to medical advance directive? No - Patient declined   Copy of Healthcare Power of Attorney in Chart? Yes - validated most recent copy scanned in chart (See row information)     Current Medications (verified) Outpatient Encounter Medications as of 05/28/2023  Medication Sig   B Complex Vitamins (B COMPLEX 1 PO) B Complex  Take once a day   Ferrous Sulfate (IRON PO)    NONFORMULARY OR COMPOUNDED ITEM Take 1 capsule by mouth daily.   rosuvastatin (CRESTOR) 5 MG tablet Take 1 tablet (5 mg total) by mouth every other day.   vitamin C (ASCORBIC ACID) 500 MG tablet Take 500 mg by mouth daily. Takes 1-2 tablets per day   No facility-administered encounter medications on file as of 05/28/2023.     Allergies (verified) Amoxicillin and Atorvastatin calcium   History: Past Medical History:  Diagnosis Date   Anxiety    Depression    Fibroid    High cholesterol    Pure hypercholesterolemia 01/13/2021   Substance abuse (HCC)    recovering alcohol--sober for 27 years   Thyroid disease    Past Surgical History:  Procedure Laterality Date   bartholins cyst removed  04/10/1998   CATARACT EXTRACTION Bilateral    06/2021   COLONOSCOPY  05/2016   cyst removed     Vagina   UTERINE FIBROID SURGERY     Family History  Problem Relation Age of Onset   Cancer Mother        breast   Alzheimer's disease Mother    Stroke Father    Diabetes Father    Ataxia Neg Hx    Social History   Socioeconomic History   Marital status: Divorced    Spouse name: Not on file   Number of children: 0   Years of education: Master's   Highest education level: Not on file  Occupational History   Occupation: [employer]  Tobacco Use   Smoking status: Former    Current packs/day: 0.00    Types: Cigarettes    Quit date: 05/14/1995    Years since quitting: 28.0   Smokeless tobacco: Never  Vaping Use   Vaping status: Never Used  Substance and Sexual Activity  Alcohol use: No   Drug use: No   Sexual activity: Not Currently    Birth control/protection: Post-menopausal  Other Topics Concern   Not on file  Social History Narrative   Lives alone   Caffeine use: Drinks coffee/tea/soda ocass   Semi retired, working at Engineer, site at [employer]   Exercise - some   07/2022         Social Drivers of Health   Financial Resource Strain: Low Risk  (05/28/2023)   Overall Financial Resource Strain (CARDIA)    Difficulty of Paying Living Expenses: Not hard at all  Food Insecurity: No Food Insecurity (05/28/2023)   Hunger Vital Sign    Worried About Running Out of Food in the Last Year: Never true    Ran Out of Food in the Last Year: Never true  Transportation  Needs: No Transportation Needs (05/28/2023)   PRAPARE - Administrator, Civil Service (Medical): No    Lack of Transportation (Non-Medical): No  Physical Activity: Insufficiently Active (05/28/2023)   Exercise Vital Sign    Days of Exercise per Week: 7 days    Minutes of Exercise per Session: 20 min  Stress: No Stress Concern Present (05/28/2023)   [employer] of Occupational Health - Occupational Stress Questionnaire    Feeling of Stress : Not at all  Social Connections: Moderately Integrated (05/28/2023)   Social Connection and Isolation Panel [NHANES]    Frequency of Communication with Friends and Family: More than three times a week    Frequency of Social Gatherings with Friends and Family: More than three times a week    Attends Religious Services: More than 4 times per year    Active Member of [employer] or Organizations: Yes    Attends Engineer, structural: More than 4 times per year    Marital Status: Divorced    Tobacco Counseling Counseling given: Not Answered   Clinical Intake:  Pre-visit preparation completed: Yes  Pain : No/denies pain     BMI - recorded: 26.11 Nutritional Status: BMI 25 -29 Overweight Nutritional Risks: None Diabetes: No  How often do you need to have someone help you when you read instructions, pamphlets, or other written materials from your doctor or pharmacy?: 1 - Never  Interpreter Needed?: No  Information entered by :: Theresa Mulligan LPN   Activities of Daily Living    05/28/2023    8:11 AM  In your present state of health, do you have any difficulty performing the following activities:  Hearing? 0  Vision? 0  Difficulty concentrating or making decisions? 0  Walking or climbing stairs? 0  Dressing or bathing? 0  Doing errands, shopping? 0  Preparing Food and eating ? N  Using the Toilet? N  In the past six months, have you accidently leaked urine? N  Do you have problems with loss of bowel control? N   Managing your Medications? N  Managing your Finances? N  Housekeeping or managing your Housekeeping? N    Patient Care Team: Tysinger, Kermit Balo, PA-C as PCP - General (Family Medicine)  Indicate any recent Medical Services you may have received from other than [facility] providers in the past year (date may be approximate).     Assessment:   This is a routine wellness examination for [place].  Hearing/Vision screen Hearing Screening - Comments:: Denies hearing difficulties   Vision Screening - Comments:: Wears rx glasses - up to date with routine eye exams with  Dr  Groat   Goals Addressed               This Visit's Progress     Continue to exercise (pt-stated)        Stay active.       Depression Screen    05/28/2023    8:11 AM 05/28/2023    8:10 AM 08/08/2022    3:41 PM 01/13/2022    9:33 AM 06/28/2021    3:01 PM 12/31/2020   10:58 AM 09/23/2020    1:47 PM  PHQ 2/9 Scores  PHQ - 2 Score 0 0 0 0 2 1 3   PHQ- 9 Score    5 2  5     Fall Risk    05/28/2023    8:11 AM 08/08/2022    3:41 PM 01/13/2022    9:33 AM 06/28/2021    3:00 PM 02/14/2021    3:03 PM  Fall Risk   Falls in the past year? 0 0 0 0 0  Number falls in past yr: 0 0 0 0 0  Injury with Fall? 0 0 0 0 0  Risk for fall due to : No Fall Risks No Fall Risks No Fall Risks  No Fall Risks  Follow up Falls prevention discussed;Falls evaluation completed Falls evaluation completed Falls prevention discussed Falls evaluation completed Falls evaluation completed    MEDICARE RISK AT HOME: Medicare Risk at Home Any stairs in or around the home?: No If so, are there any without handrails?: No Home free of loose throw rugs in walkways, pet beds, electrical cords, etc?: Yes Adequate lighting in your home to reduce risk of falls?: Yes Life alert?: No Use of a cane, walker or w/c?: No Grab bars in the bathroom?: No Shower chair or bench in shower?: No Elevated toilet seat or a handicapped toilet?: No  TIMED UP AND  GO:  Was the test performed?  No    Cognitive Function:        05/28/2023    8:12 AM 01/13/2022    9:35 AM  6CIT Screen  What Year? 0 points 0 points  What month? 0 points 0 points  What time? 0 points 0 points  Count back from 20 0 points 0 points  Months in reverse 0 points 0 points  Repeat phrase 0 points 0 points  Total Score 0 points 0 points    Immunizations Immunization History  Administered Date(s) Administered   Fluad Quad(high Dose 65+) 02/14/2021   Moderna SARS-COV2 Booster Vaccination 02/26/2020   PNEUMOCOCCAL CONJUGATE-20 02/27/2022   Pfizer Covid-19 Vaccine Bivalent Booster 54yrs & up 12/31/2020   Pfizer(Comirnaty)Fall Seasonal Vaccine 12 years and older 02/20/2022   Tdap 10/23/2017   Unspecified SARS-COV-2 Vaccination 04/23/2019, 05/16/2019   Zoster Recombinant(Shingrix) 11/03/2017, 02/13/2018    TDAP status: Up to date  Flu Vaccine status: Declined, Education has been provided regarding the importance of this vaccine but patient still declined. Advised may receive this vaccine at local pharmacy or Health Dept. Aware to provide a copy of the vaccination record if obtained from local pharmacy or Health Dept. Verbalized acceptance and understanding.  Pneumococcal vaccine status: Up to date  Covid-19 vaccine status: Declined, Education has been provided regarding the importance of this vaccine but patient still declined. Advised may receive this vaccine at local pharmacy or Health Dept.or vaccine clinic. Aware to provide a copy of the vaccination record if obtained from local pharmacy or Health Dept. Verbalized acceptance and understanding.  Qualifies for Shingles Vaccine? Yes  Zostavax completed Yes   Shingrix Completed?: Yes  Screening Tests Health Maintenance  Topic Date Due   INFLUENZA VACCINE  11/09/2022   COVID-19 Vaccine (6 - 2024-25 season) 12/10/2022   MAMMOGRAM  10/11/2023   Medicare Annual Wellness (AWV)  05/27/2024   Colonoscopy  06/05/2026    DTaP/Tdap/Td (2 - Td or Tdap) 10/24/2027   Pneumonia Vaccine 2+ Years old  Completed   DEXA SCAN  Completed   Hepatitis C Screening  Completed   Zoster Vaccines- Shingrix  Completed   HPV VACCINES  Aged Out    Health Maintenance  Health Maintenance Due  Topic Date Due   INFLUENZA VACCINE  11/09/2022   COVID-19 Vaccine (6 - 2024-25 season) 12/10/2022    Colorectal cancer screening: Type of screening: Colonoscopy. Completed 07/14/21. Repeat every 10 years  Mammogram status: Completed 10/10/21. Repeat every year  Bone Density status: Completed 10/10/21. Results reflect: Bone density results: OSTEOPENIA. Repeat every   years.     Additional Screening:  Hepatitis C Screening: does qualify; Completed 01/07/21  Vision Screening: Recommended annual ophthalmology exams for early detection of glaucoma and other disorders of the eye. Is the patient up to date with their annual eye exam?  Yes  Who is the provider or what is the name of the office in which the patient attends annual eye exams? Dr Katrina Stack If pt is not established with a provider, would they like to be referred to a provider to establish care? No .   Dental Screening: Recommended annual dental exams for proper oral hygiene    Community Resource Referral / Chronic Care Management:  CRR required this visit?  No   CCM required this visit?  No     Plan:     I have personally reviewed and noted the following in the patient's chart:   Medical and social history Use of alcohol, tobacco or illicit drugs  Current medications and supplements including opioid prescriptions. Patient is not currently taking opioid prescriptions. Functional ability and status Nutritional status Physical activity Advanced directives List of other physicians Hospitalizations, surgeries, and ER visits in previous 12 months Vitals Screenings to include cognitive, depression, and falls Referrals and appointments  In addition, I have reviewed  and discussed with patient certain preventive protocols, quality metrics, and best practice recommendations. A written personalized care plan for preventive services as well as general preventive health recommendations were provided to patient.     Tillie Rung, LPN   05/10/8655   After Visit Summary: (MyChart) Due to this being a telephonic visit, the after visit summary with patients personalized plan was offered to patient via MyChart   Nurse Notes: None

## 2023-05-28 NOTE — Patient Instructions (Addendum)
 Ms. Augsburger , Thank you for taking time to come for your Medicare Wellness Visit. I appreciate your ongoing commitment to your health goals. Please review the following plan we discussed and let me know if I can assist you in the future.   Referrals/Orders/Follow-Ups/Clinician Recommendations:   This is a list of the screening recommended for you and due dates:  Health Maintenance  Topic Date Due   Flu Shot  11/09/2022   COVID-19 Vaccine (6 - 2024-25 season) 12/10/2022   Mammogram  10/11/2023   Medicare Annual Wellness Visit  05/27/2024   Colon Cancer Screening  06/05/2026   DTaP/Tdap/Td vaccine (2 - Td or Tdap) 10/24/2027   Pneumonia Vaccine  Completed   DEXA scan (bone density measurement)  Completed   Hepatitis C Screening  Completed   Zoster (Shingles) Vaccine  Completed   HPV Vaccine  Aged Out    Advanced directives: (In Chart) A copy of your advanced directives are scanned into your chart should your provider ever need it.  Next Medicare Annual Wellness Visit scheduled for next year: Yes

## 2024-01-10 ENCOUNTER — Encounter: Payer: Self-pay | Admitting: *Deleted

## 2024-01-10 ENCOUNTER — Telehealth: Payer: Self-pay | Admitting: *Deleted

## 2024-01-10 NOTE — Telephone Encounter (Signed)
 Copied from CRM [id number]. Topic: Clinical - Medical Advice >> Jan 10, 2024  9:47 AM Myrick T wrote: Reason for CRM: patient called to get her blood type. Please respond via [employer] sent via MyChart.
